# Patient Record
Sex: Female | Born: 1970 | Race: White | Hispanic: No | Marital: Married | Smoking: Never smoker
Health system: Southern US, Community
[De-identification: ages and names within clinical notes are randomized; demographics above are authoritative.]

## PROBLEM LIST (undated history)

## (undated) DIAGNOSIS — I959 Hypotension, unspecified: Secondary | ICD-10-CM

## (undated) HISTORY — DX: Hypotension, unspecified: I95.9

## (undated) HISTORY — PX: PELVIC LAPAROSCOPY: SHX162

## (undated) HISTORY — PX: AUGMENTATION MAMMAPLASTY: SUR837

---

## 1996-11-03 HISTORY — PX: RHINOPLASTY: SUR1284

## 1998-12-03 ENCOUNTER — Other Ambulatory Visit: Admission: RE | Admit: 1998-12-03 | Discharge: 1998-12-03 | Payer: Self-pay | Admitting: Gynecology

## 1999-05-08 ENCOUNTER — Other Ambulatory Visit: Admission: RE | Admit: 1999-05-08 | Discharge: 1999-05-08 | Payer: Self-pay | Admitting: Gynecology

## 1999-12-06 ENCOUNTER — Other Ambulatory Visit: Admission: RE | Admit: 1999-12-06 | Discharge: 1999-12-06 | Payer: Self-pay | Admitting: Gynecology

## 2000-11-03 HISTORY — PX: BREAST SURGERY: SHX581

## 2001-01-14 ENCOUNTER — Other Ambulatory Visit: Admission: RE | Admit: 2001-01-14 | Discharge: 2001-01-14 | Payer: Self-pay | Admitting: Gynecology

## 2002-01-25 ENCOUNTER — Other Ambulatory Visit: Admission: RE | Admit: 2002-01-25 | Discharge: 2002-01-25 | Payer: Self-pay | Admitting: Gynecology

## 2003-01-26 ENCOUNTER — Other Ambulatory Visit: Admission: RE | Admit: 2003-01-26 | Discharge: 2003-01-26 | Payer: Self-pay | Admitting: Gynecology

## 2004-02-05 ENCOUNTER — Other Ambulatory Visit: Admission: RE | Admit: 2004-02-05 | Discharge: 2004-02-05 | Payer: Self-pay | Admitting: Gynecology

## 2005-02-05 ENCOUNTER — Other Ambulatory Visit: Admission: RE | Admit: 2005-02-05 | Discharge: 2005-02-05 | Payer: Self-pay | Admitting: Gynecology

## 2006-02-09 ENCOUNTER — Other Ambulatory Visit: Admission: RE | Admit: 2006-02-09 | Discharge: 2006-02-09 | Payer: Self-pay | Admitting: Gynecology

## 2006-02-16 ENCOUNTER — Encounter: Admission: RE | Admit: 2006-02-16 | Discharge: 2006-02-16 | Payer: Self-pay | Admitting: Gynecology

## 2007-02-15 ENCOUNTER — Other Ambulatory Visit: Admission: RE | Admit: 2007-02-15 | Discharge: 2007-02-15 | Payer: Self-pay | Admitting: Gynecology

## 2007-08-30 ENCOUNTER — Other Ambulatory Visit: Admission: RE | Admit: 2007-08-30 | Discharge: 2007-08-30 | Payer: Self-pay | Admitting: Gynecology

## 2008-03-29 ENCOUNTER — Other Ambulatory Visit: Admission: RE | Admit: 2008-03-29 | Discharge: 2008-03-29 | Payer: Self-pay | Admitting: Gynecology

## 2008-04-11 ENCOUNTER — Encounter: Admission: RE | Admit: 2008-04-11 | Discharge: 2008-04-11 | Payer: Self-pay | Admitting: Gynecology

## 2008-10-16 ENCOUNTER — Ambulatory Visit: Payer: Self-pay | Admitting: Gynecology

## 2009-06-12 ENCOUNTER — Ambulatory Visit: Payer: Self-pay | Admitting: Gynecology

## 2009-06-12 ENCOUNTER — Other Ambulatory Visit: Admission: RE | Admit: 2009-06-12 | Discharge: 2009-06-12 | Payer: Self-pay | Admitting: Gynecology

## 2009-06-12 ENCOUNTER — Encounter: Payer: Self-pay | Admitting: Gynecology

## 2009-06-27 ENCOUNTER — Encounter: Admission: RE | Admit: 2009-06-27 | Discharge: 2009-06-27 | Payer: Self-pay | Admitting: Gynecology

## 2010-02-27 ENCOUNTER — Ambulatory Visit: Payer: Self-pay | Admitting: Gynecology

## 2010-03-15 IMAGING — MG MM SCREENING W/IMPLANTS
8 series · 8 of 8 positions shown · non-contrast
Comparison: Prior studies.

DG SCREENING W/IMPLANTS
Bilateral CC and MLO view(s) were taken.
Prior study comparison: February 16, 2006, bilateral DG screening w/implants.

DIGITAL SCREENING MAMMOGRAM W/IMPLANTS WITH CAD:

[R CC]
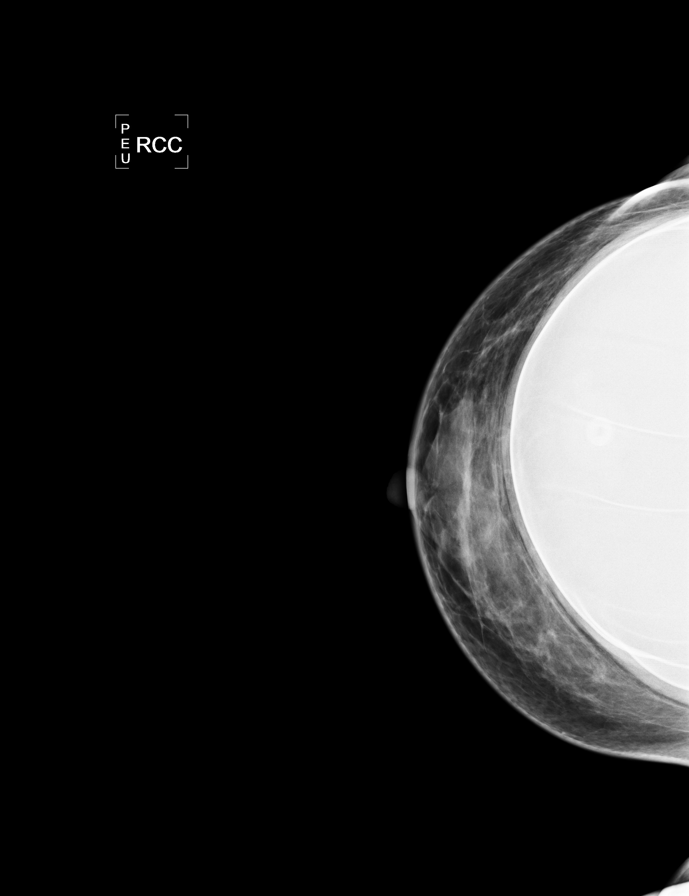

[L CC]
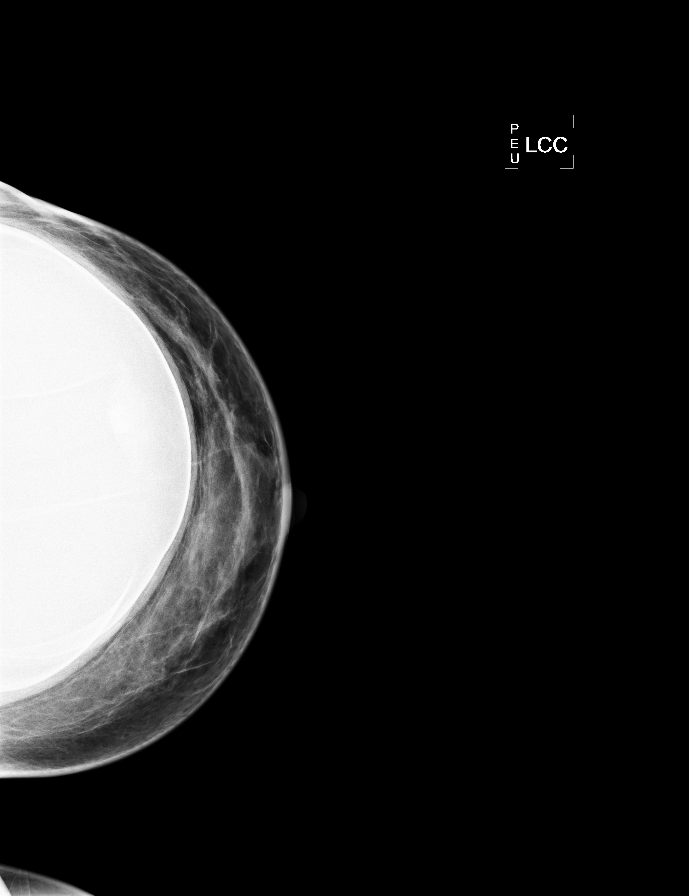

[L MLO]
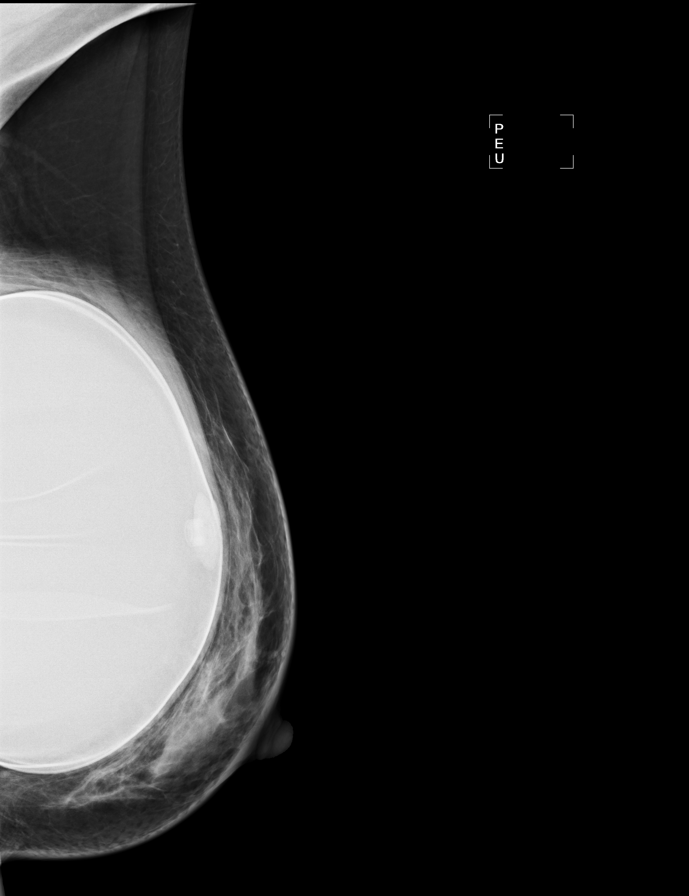

[R MLO]
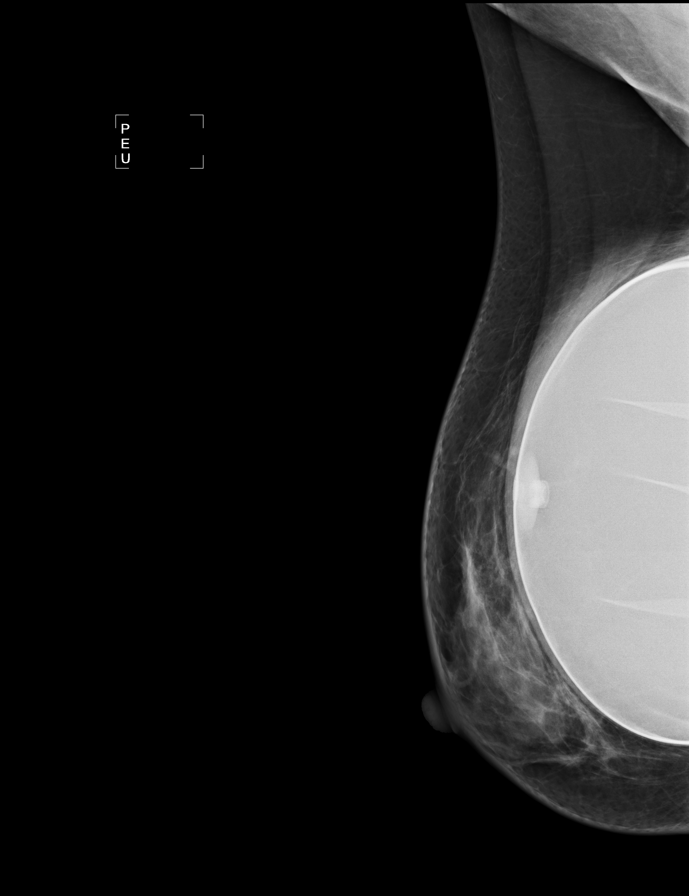

[R CCID]
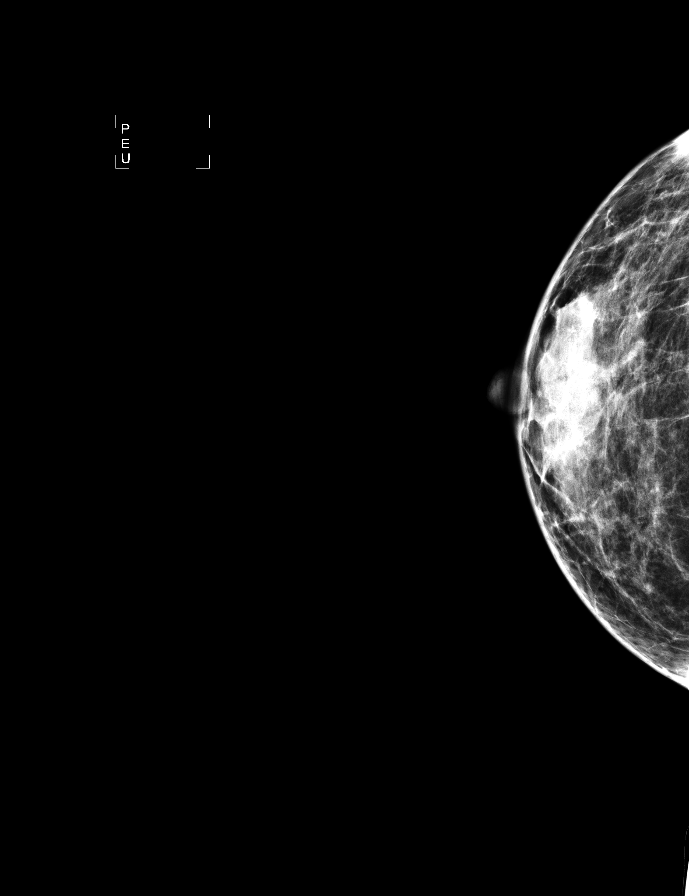

[L CCID]
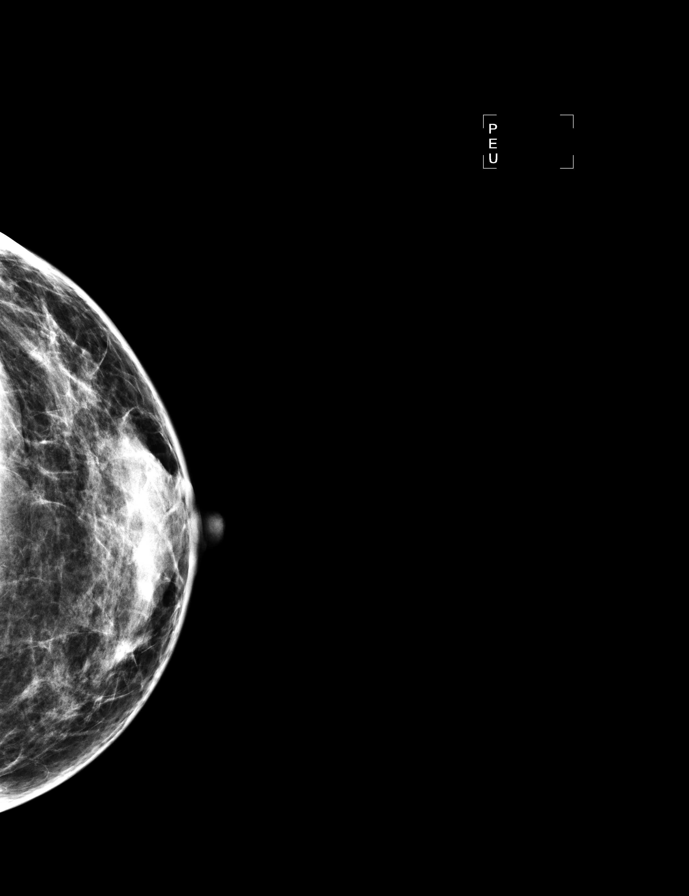

[L MLOID]
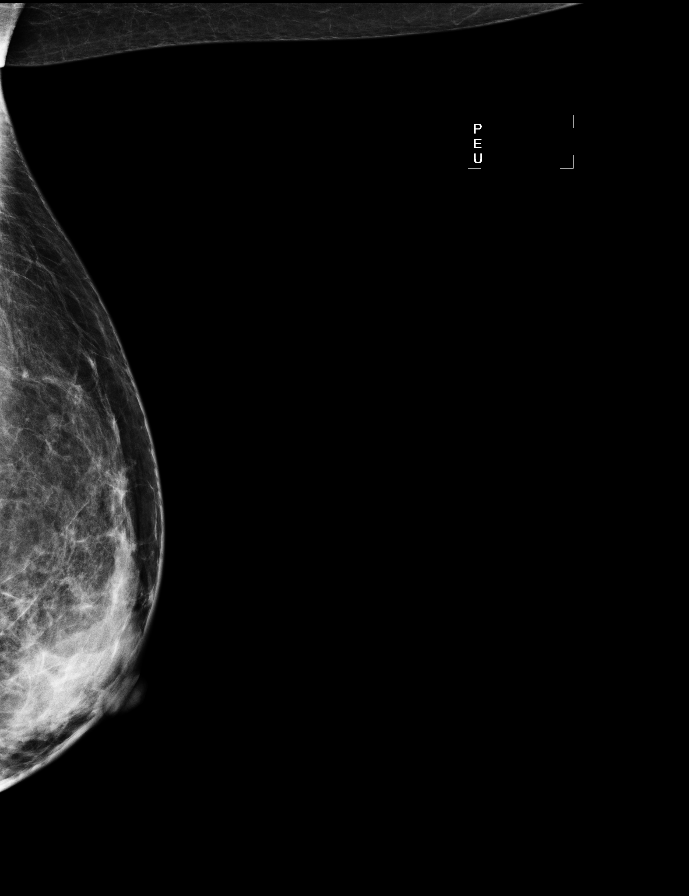

[R MLOID]
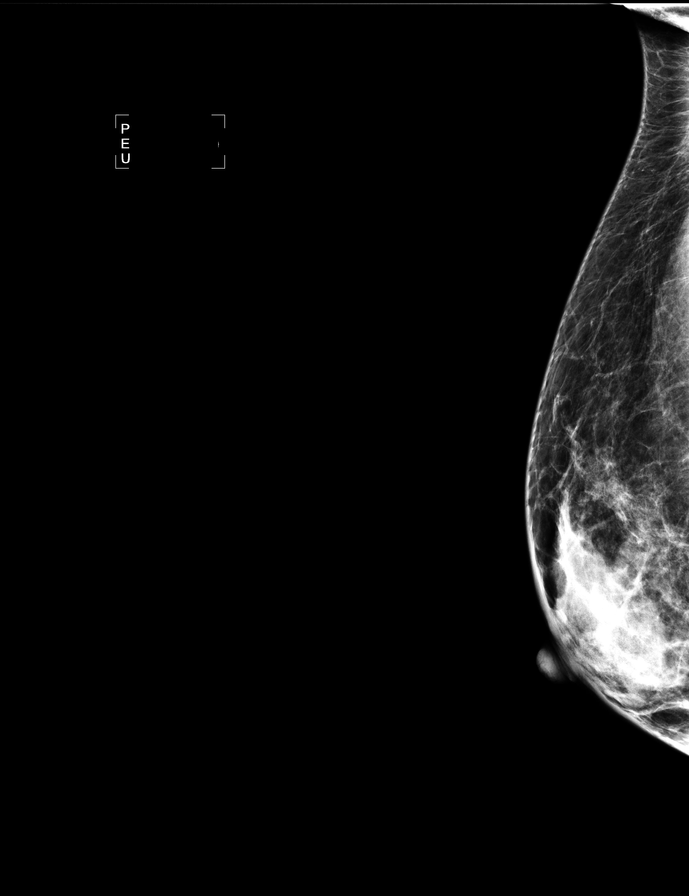

[8 of 8 positions shown; findings below may reference images not displayed]

There are subpectoral saline implants.  Implant included and implant displaced views are obtained.

The breast tissue is heterogeneously dense.  There is no dominant mass, architectural distortion or
calcification to suggest malignancy.

Images were processed with CAD.
IMPRESSION: No mammographic evidence of malignancy.  Suggest yearly screening mammography.

A result letter of this screening mammogram will be mailed directly to the patient.

ASSESSMENT: Negative - BI-RADS 1

Screening mammogram of both breasts in 1 year.
,

## 2010-08-13 ENCOUNTER — Ambulatory Visit: Payer: Self-pay | Admitting: Gynecology

## 2010-08-13 ENCOUNTER — Other Ambulatory Visit: Admission: RE | Admit: 2010-08-13 | Discharge: 2010-08-13 | Payer: Self-pay | Admitting: Gynecology

## 2010-09-19 ENCOUNTER — Encounter: Admission: RE | Admit: 2010-09-19 | Discharge: 2010-09-19 | Payer: Self-pay | Admitting: Gynecology

## 2011-08-18 ENCOUNTER — Encounter: Payer: Self-pay | Admitting: Anesthesiology

## 2011-08-18 ENCOUNTER — Other Ambulatory Visit: Payer: Self-pay | Admitting: Gynecology

## 2011-08-18 DIAGNOSIS — Z1231 Encounter for screening mammogram for malignant neoplasm of breast: Secondary | ICD-10-CM

## 2011-08-26 ENCOUNTER — Ambulatory Visit (INDEPENDENT_AMBULATORY_CARE_PROVIDER_SITE_OTHER): Payer: Self-pay | Admitting: Gynecology

## 2011-08-26 ENCOUNTER — Encounter: Payer: Self-pay | Admitting: Gynecology

## 2011-08-26 ENCOUNTER — Other Ambulatory Visit (HOSPITAL_COMMUNITY)
Admission: RE | Admit: 2011-08-26 | Discharge: 2011-08-26 | Disposition: A | Discharge status: Home / Self Care | Payer: Self-pay | Source: Ambulatory Visit | Attending: Gynecology | Admitting: Gynecology

## 2011-08-26 DIAGNOSIS — IMO0001 Reserved for inherently not codable concepts without codable children: Secondary | ICD-10-CM

## 2011-08-26 DIAGNOSIS — Z01419 Encounter for gynecological examination (general) (routine) without abnormal findings: Secondary | ICD-10-CM

## 2011-08-26 DIAGNOSIS — R823 Hemoglobinuria: Secondary | ICD-10-CM

## 2011-08-26 DIAGNOSIS — R300 Dysuria: Secondary | ICD-10-CM

## 2011-08-26 DIAGNOSIS — Z309 Encounter for contraceptive management, unspecified: Secondary | ICD-10-CM

## 2011-08-26 MED ORDER — LEVONORGESTREL-ETHINYL ESTRAD 0.1-20 MG-MCG PO TABS: 1.0000 | ORAL_TABLET | Freq: Every day | ORAL | Status: DC

## 2011-08-26 NOTE — Progress Notes (Signed)
[name] [birth year] [id number]   History:    40 y.o.  for annual exam with a complaint of urinary frequency and dysuria. She frequently does her monthly self breast examination. Last mammogram November 2011. She denies fever chills nausea vomiting her back pain to some slight suprapubic discomfort. She is only Laura Short 40 year old contraceptive pills and is having normal menstrual cycles.  Past medical history,surgical history, family history and social history were all reviewed and documented in the EPIC chart.  ROS:  Was performed and pertinent positives and negatives are included in the history.  Exam: chaperone present BP 110/70  Ht 5\' 2"  (1.575 m)  Wt 127 lb (57.607 kg)  BMI 23.23 kg/m2  LMP 08/06/2011  Body mass index is 23.23 kg/(m^2).  General appearance : Well developed well nourished female. No acute distress HEENT: Neck supple, trachea midline, no carotid bruits, no thyroidmegaly Lungs: Clear to auscultation, no rhonchi or wheezes, or rib retractions  Heart: Regular rate and rhythm, no murmurs or gallops Breast:Examined in sitting and supine position were symmetrical in appearance, no palpable masses or tenderness,  no skin retraction, no nipple inversion, no nipple discharge, no skin discoloration, no axillary or supraclavicular lymphadenopathy Abdomen: no palpable masses or tenderness, no rebound or guarding Extremities: no edema or skin discoloration or tenderness  Pelvic:  Bartholin, Urethra, Skene Glands: Within normal limits             Vagina: No gross lesions or discharge  Cervix: No gross lesions or discharge  Uterus  anteverted, normal size, shape and consistency, non-tender and mobile  Adnexa  Without masses or tenderness  Anus and perineum  normal   Rectovaginal  normal sphincter tone without palpated masses or tenderness             Hemoccult not done     Assessment/Plan:  40 y.o. female for annual exam with evidence of urinary tract infection based on  her symptoms and urinalysis demonstrating 6-8 WBC 1+ bacteria. She'll be placed on Cipro 500 mg twice a day for a seven-day course and pyridium 200 mg 3 times a day when necessary for to 3 days. She was encouraged to continue monthly self breast examination. Requisition was provided for to schedule her next mammogram next month. CBC cholesterol urinalysis and Pap smear was done today. We'll see her back in one year or when necessary.    Ok Edwards MD, 3:27 PM 08/26/2011

## 2011-09-23 ENCOUNTER — Ambulatory Visit
Admission: RE | Admit: 2011-09-23 | Discharge: 2011-09-23 | Disposition: A | Discharge status: Home / Self Care | Payer: Self-pay | Source: Ambulatory Visit | Attending: Gynecology | Admitting: Gynecology

## 2011-09-23 DIAGNOSIS — Z1231 Encounter for screening mammogram for malignant neoplasm of breast: Secondary | ICD-10-CM

## 2012-07-15 ENCOUNTER — Other Ambulatory Visit: Payer: Self-pay | Admitting: Gynecology

## 2012-09-28 ENCOUNTER — Encounter: Payer: Self-pay | Admitting: Gynecology

## 2012-09-28 ENCOUNTER — Other Ambulatory Visit: Payer: Self-pay | Admitting: Gynecology

## 2012-09-28 ENCOUNTER — Ambulatory Visit (INDEPENDENT_AMBULATORY_CARE_PROVIDER_SITE_OTHER): Payer: Self-pay | Admitting: Gynecology

## 2012-09-28 VITALS — BP 120/70 | Ht 62.75 in | Wt 127.0 lb

## 2012-09-28 DIAGNOSIS — Z01419 Encounter for gynecological examination (general) (routine) without abnormal findings: Secondary | ICD-10-CM

## 2012-09-28 DIAGNOSIS — Z803 Family history of malignant neoplasm of breast: Secondary | ICD-10-CM

## 2012-09-28 DIAGNOSIS — Z1231 Encounter for screening mammogram for malignant neoplasm of breast: Secondary | ICD-10-CM

## 2012-09-28 DIAGNOSIS — Z23 Encounter for immunization: Secondary | ICD-10-CM

## 2012-09-28 LAB — CBC WITH DIFFERENTIAL/PLATELET
Basophils Absolute: 0 10*3/uL (ref 0.0–0.1)
Basophils Relative: 1 % (ref 0–1)
Eosinophils Relative: 1 % (ref 0–5)
HCT: 39.9 % (ref 36.0–46.0)
MCH: 28.9 pg (ref 26.0–34.0)
MCHC: 32.8 g/dL (ref 30.0–36.0)
MCV: 87.9 fL (ref 78.0–100.0)
Monocytes Absolute: 0.5 10*3/uL (ref 0.1–1.0)
RDW: 12.6 % (ref 11.5–15.5)

## 2012-09-28 MED ORDER — LEVONORGESTREL-ETHINYL ESTRAD 0.1-20 MG-MCG PO TABS: 1.0000 | ORAL_TABLET | Freq: Every day | ORAL | Status: DC

## 2012-09-28 NOTE — Progress Notes (Signed)
Laura Short [birth year] [id number]   History:    41 y.o.  for annual gyn exam with no complaints today. Patient is having normal menstrual cycle and is currently on Aviane 28 day oral contraceptive pill. Patient with history several years ago in [place] she had a laparoscopic ovarian cystectomy for which we have no documentation. She also has a history of saline breast augmentation in the past.Patient's sister had breast cancer at the age of 36 and also a cousin as well. One cousin also had ovarian cancer.  Review of her record also indicated in the past and she had been offered BRCA 1 BRCA2 testing and literature and information been provided. Due to limited financial resources she has not had this done. Patient was requesting a flu vaccine today. Her last mammogram was November 2012 which was normal. Patient currently does her self breast examination.   Past medical history,surgical history, family history and social history were all reviewed and documented in the EPIC chart.  Gynecologic History Patient's last menstrual period was 09/01/2012. Contraception: OCP (estrogen/progesterone) Last Pap: 2012. Results were: normal Last mammogram: 2012. Results were: normal  Obstetric History OB History    Grav Para Term Preterm Abortions TAB SAB Ect Mult Living   2 2 2       2      # Outc Date GA Lbr Len/2nd Wgt Sex Del Anes PTL Lv   1 TRM     M SVD   Yes   2 TRM     F SVD   Yes       ROS: A ROS was performed and pertinent positives and negatives are included in the history.  GENERAL: No fevers or chills. HEENT: No change in vision, no earache, sore throat or sinus congestion. NECK: No pain or stiffness. CARDIOVASCULAR: No chest pain or pressure. No palpitations. PULMONARY: No shortness of breath, cough or wheeze. GASTROINTESTINAL: No abdominal pain, nausea, vomiting or diarrhea, melena or bright red blood per rectum. GENITOURINARY: No urinary frequency, urgency, hesitancy or dysuria.  MUSCULOSKELETAL: No joint or muscle pain, no back pain, no recent trauma. DERMATOLOGIC: No rash, no itching, no lesions. ENDOCRINE: No polyuria, polydipsia, no heat or cold intolerance. No recent change in weight. HEMATOLOGICAL: No anemia or easy bruising or bleeding. NEUROLOGIC: No headache, seizures, numbness, tingling or weakness. PSYCHIATRIC: No depression, no loss of interest in normal activity or change in sleep pattern.     Exam: chaperone present  BP 120/70  Ht 5' 2.75" (1.594 m)  Wt 127 lb (57.607 kg)  BMI 22.68 kg/m2  LMP 09/01/2012  Body mass index is 22.68 kg/(m^2).  General appearance : Well developed well nourished female. No acute distress HEENT: Neck supple, trachea midline, no carotid bruits, no thyroidmegaly Lungs: Clear to auscultation, no rhonchi or wheezes, or rib retractions  Heart: Regular rate and rhythm, no murmurs or gallops Breast:Examined in sitting and supine position were symmetrical in appearance, no palpable masses or tenderness,  no skin retraction, no nipple inversion, no nipple discharge, no skin discoloration, no axillary or supraclavicular lymphadenopathy Abdomen: no palpable masses or tenderness, no rebound or guarding Extremities: no edema or skin discoloration or tenderness  Pelvic:  Bartholin, Urethra, Skene Glands: Within normal limits             Vagina: No gross lesions or discharge  Cervix: No gross lesions or discharge  Uterus  anteverted, normal size, shape and consistency, non-tender and mobile  Adnexa  Without masses or tenderness  Anus  and perineum  normal   Rectovaginal  normal sphincter tone without palpated masses or tenderness             Hemoccult not indicated     Assessment/Plan:  41 y.o. female for annual exam with no gross abnormalities noted. Patient will be scheduled for mammogram and I have recommended that she have a three-dimensional study due to her family history of breast cancer, prior breast augmentation and dense  breasts. She was encouraged to continue her monthly self breast examination. We once again recommended BRCA one BRCA2 testing. The following labs will be ordered: CBC, cholesterol, urinalysis. Patient received flu vaccine today. We discussed importance of calcium vitamin D along with regular exercise for osteoporosis prevention. No Pap smear done today new guidelines discussed.    Ok Edwards MD, 12:56 PM 09/28/2012

## 2012-09-28 NOTE — Patient Instructions (Addendum)
Calcium [phone] mg/dia Vitamina D3 no mas de 2,000 unidades por dia  [place] difteria/ttanos (Td) o [place] difteria, ttanos, tos convulsa (Tdap), Lo que debe saber (Tetanus, Diphtheria [Td] or Tetanus, Diphtheria, Pertussis [Tdap] Vaccine, What You Need to Know) PORQU VACUNARSE? El ttanos , la difteria y la tos ferina pueden ser enfermedades graves.  El TTANOS  (trismo) provoca la contraccin dolorosa y rigidez de los msculos, por lo general, en todo el cuerpo.   Puede causar la contraccin de los msculos de la cabeza y el cuello de modo que el enfermo no puede abrir la boca ni tragar., y en algunos casos, tampoco puede respirar.. El ttanos causa la muerte de 1 de cada 5 personas que se infectan. LA DIFTERIA produce la formacin de una membrana gruesa que cubre el fondo de la garganta.  Puede causar problemas respiratorios, parlisis, insuficiencia cardaca, e incluso la muerte. El PERTUSIS (tos [place]) causa ataques de tos intensa que pueden dificultar la respiracin, provocar vmitos e interrumpir el sueo.   Puede causar prdida de peso, incontinencia, fractura de [place], y desmayos por la intensa tos. Hasta de 2 de cada 100 adolescentes y 5 de cada 100 adultos que enferman de tos [place] deben ser hospitalizados o tienen complicaciones como la neumona y la [place]. Estas 3 enfermedades son provocadas por bacterias. La difteria y la tos Benetta Spar se [place] de persona a Social worker. El ttanos ingresa al organismo a travs de cortes, rasguos o heridas. En los [place] ocurran alrededor de 200 000 casos por ao de difteria y tos [place], antes de que existieran las [place], y tambin ocurran cientos de casos de ttanos. Desde la aparicin de las vacunas, el ttanos y la difteria han disminuido en alrededor del 99% y los casos de tos ferina disminuyeron aproximadamente el 92%.  Los nios menores de 6 aos deben recibir la vacuna DTaP para estar protegidos contra estas tres  enfermedades. Pero los [employer], los adolescentes y los adultos tambin necesitan proteccin. VACUNAS PARA ADOLESCENTES Y ADULTOS Vacunas Tdap y Td  Hay dos vacunas disponibles para proteger de estas enfermedades a nios a Glass blower/designer de los 7aos:   La vacuna Td fue utilizada durante muchos aos. Protege contra el ttanos y la difteria.  La vacuna Tdap fue autorizada en 2005. Es la primera vacuna para adolescentes y adultos que protege contra la tos ferina y el ttanos y la difteria. Una dosis de refuerzo de la Td se recomienda cada 10 aos. La Tdap se aplica slo una vez.  QU VACUNA DEBO APLICARME Y CUANDO? Las edades de 7 a 18 aos  Dynegy 11 y los 12 aos se recomienda una dosis de Tdap. Esta dosis puede aplicarse desde los 7 aos en los nios que no han recibido una o ms dosis de DTaP anteriormente.  Los nios y adolescentes que no recibieron todas las dosis programadas de DTaP o DTP a los 7 aos deben completar la serie usando una combinacin de Td y Tdap. Adultos de 19 aos o ms  [employer] adultos deben recibir una dosis de refuerzo de Td cada 10 aos. Los adultos de menos de 65 aos que nunca hayan recibido la Tdap deben reemplazarla por la siguiente dosis de refuerzo. Los adultos a partir de los 65 aos puedenrecibir una dosis de Tdap.  Los adultos (incluyendo las mujeres que podran quedar embarazadas y los adultos mayores de 65 aos) que tienen contacto cercano con un beb menor de 12 meses deben aplicarse una dosis de  Tdap para proteger al beb de la tos ferina.  Los trabajadores de la salud que tengan contacto directo con pacientes en hospitales o clnicas deben recibir una dosis de Tdap. Proteccin despus de [place] herida  Es posible que una persona que tenga un corte o quemadura grave necesite una dosis de Td o Tdap para prevenir la infeccin por ttanos. Puede usarse la Tdap en personas que nunca recibieron una dosis. Pero debe usarse la Td, si la Tdap no se encuentra  disponible, o para:  Cualquier persona que haya recibido una dosis de Tdap.  Los nios [employer] 7 y los 9 aos que han [employer].H. [employer] Worldwide series de DTap anteriormente.  Adultos de 65 aos o ms. Mujeres embarazadas.   Las mujeres embarazadas que nunca recibieron una dosis de Ddap deben recibirla despus de la 20a semana de gestacin y preferiblemente durante Contractor. trimestre. Si no se aplican la Tdap durante el embarazo, deben recibirla lo antes posible despus del parto. Las mujeres embarazadas que han recibido la Tdap y tienen que aplicarse la vacuna contra el ttanos o la difteria durante el [place], deben recibir la Td. Las vacunas Tdap y Td pueden ser administradas al mismo tiempo que otras vacunas. ALGUNAS PERSONAS NO DEBEN RECIBIR LA VACUNA O DEBEN [employer]ard  Las personas que hayan tenido una reaccin alrgica que haya puesto en peligro su vida despus de una dosis de vacuna contra el ttanos, la difteria o la tos ferina no deben recibir Td ni Tdap..  Las personas que tengan alergias graves a algn componente de una vacuna no deben recibir esa vacuna. Informe a su mdico si la persona que recibe la vacuna sufre alergias graves.  Cualquier persona que American Standard Companies en coma o que haya tenido convulsiones dentro de los 7 [address]  [address] posteriores despus de una dosis de DTP o DTaP no debe recibir la Tdap, salvo que se encuentre una causa que no fuera la vacuna. Estas personas pueden recibir Td.  Consulte a su mdico si la persona que recibe [place] de las vacunas:  Tiene epilepsia o algn otro problema del sistema nervioso.  Tuvo inflamacin o dolor intenso despus de una dosis de DTP, DTaP, DT, Td, o Tdap.  Ha tenido el sndrome de Scientific laboratory technician (GBS por sus siglas en ingls). Las personas que sufran una enfermedad moderada o grave el da en que se programa la vacuna, deben esperar a recuperarse para recibir las vacunas Tdap o Td. Por lo general, una persona con una enfermedad leve o fiebre baja  puede recibir la vacuna. CULES SON LOS RIESGOS DE LAS VACUNAS TDAP Y TD? Con Cathleen Corti, al igual que con cualquier [employer] Data, siempre existe un pequeo riesgo de una reaccin alrgica que ponga en peligro la vida o cause otro problema grave. Todo procedimiento mdico, inclusive la vacunacin pueden causar breves episodios de lipotimia o sntomas relacionados (como movimientos espasmdicos). Para evitar los [employer] y las lesiones causadas por las cadas, permanezca sentado o recustese durante los 15 minutos posteriores a la vacunacin. Informe a su mdico si el paciente se siente dbil o mareado, tiene cambios en la visin o siente zumbidos en los odos.  Es mucho ms probable que tener ttanos, difteria, o tos ferina cause problemas ms graves que los provocados por recibir cualquiera de las vacunas Td o Tdap. A continuacin se enumeran los problemas informados despus de las vacunas Td y Tdap. Problemas Leves (perceptibles, pero que no interfirieron con las actividades): Tdap  Dolor (alrededor de 3 de cada  4 adolescentes y 2 de cada 3 adultos).  Enrojecimiento o inflamacin en el sitio de la inyeccin (alrededor de 1 de cada 5).  Fiebre leve de al menos 100.4 F (38 C) (hasta alrededor de 1 cada 25 adolescentes y 1 de cada 100 adultos).  Dolor de cabeza (alrededor de 4 de cada 10 adolescentes y 3 de cada 10 adultos).  Cansancio (alrededor de 1 de cada 3 adolescentes y 1 de cada 4 adultos).  Nuseas, vmitos, diarrea, o dolor de estmago (hasta 1 de cada 4 adolescentes y 1 de cada 10 adultos).  Escalofros, dolores corporales, dolor articular, erupciones, o inflamacin de las glndulas (poco frecuente). Td  Dolor (hasta alrededor de 8 de cada 10).  Enrojecimiento o inflamacin de la inyeccin (alrededor de 1 de cada 3).  Fiebre leve (hasta alrededor de 1 de cada 5).  Dolor de cabeza o cansancio (poco frecuente). Problemas Moderados (interfieren con las [place], [place] no  requieren atencin mdica): Tdap  Dolor en el sitio de la inyeccin (alrededor de 1 de cada 20 adolescentes y 1 de cada 100 adultos).  Enrojecimiento o inflamacin de la inyeccin (alrededor de 1 de cada 16 adolescentes y 1 de cada 25 adultos).  Fiebre de ms de 102 F (38.9 C) (alrededor de 1 de cada 100 adolescentes y 1 de cada 250 adultos).  Dolor de cabeza (1 de cada 300).  Nuseas, vmitos, diarrea, o dolor de estmago (hasta 3 de cada 100 adolescentes y 1 de cada 100 adultos). Td  Fiebre de ms de 102 F (38.9 C) (poco comn). Tdap o Td  Inflamacin de gran extensin en el brazo en el que se aplic la vacuna (hasta 3 de cada 100). Problemas Graves (no puede realizar [employer]; requiere Psychologist, prison and probation services) Tdap o Td  Inflamacin, dolor intenso, sangrado y enrojecimiento en el brazo, en el sitio de la inyeccin (poco frecuente). Puede producirse una reaccin alrgica grave despus de cualquier vacuna. Se estima que estas reacciones ocurren en menos de una de cada un milln de dosis. QU PASA SI HAY UNA REACCIN GRAVE? Qu signos debo buscar? Cualquier estado poco habitual, como una reaccin alrgica grave o fiebre alta. Si le produce Runner, broadcasting/film/video grave, se manifestar dentro de algunos minutos a una hora despus de recibir la vacuna. Entre los signos de Automotive engineer grave se encuentran la dificultad para respirar, debilidad, ronquera o sibilancias, latidos cardacos acelerados, urticaria, mareos, palidez, o inflamacin de la garganta. Qu debo hacer?  Comunquese con su mdico o lleve inmediatamente a la persona al mdico.  Dgale a su mdico qu ocurri, la fecha y hora en que sucedi y cundo le aplicaron la vacuna.  Pida a su mdico que informe sobre la reaccin llenando un formulario del Sistema de Informacin de Reacciones Adversos a las Administrator, arts (VAERS, por sus siglas en ingls). O, puede presentar este informe a travs del sitio web de VAERS  [email or web address] o puede llamar al [phone]. VAERS no brinda asistencia mdica. PROGRAMA NACIONAL DE COMPENSACIN DE DAOS POR VACUNAS El [place] de Compensacin de Daos por Vacunas (VICP) fue creado en 1986.  Aquellas personas que consideren que han sufrido un dao como consecuencia de una vacuna y quieren saber ms acerca del programa y como presentar Roslynn Amble, [place] llamar al [phone] o visitar su sitio web en [email or web address]  CMO Roxan Diesel MS INFORMACIN?  El profesional podr darle el prospecto de la vacuna o sugerirle otras fuentes de informacin.  Comunquese con el servicio de [employer]  de su localidad o su estado.  Comunquese con los Centros para el control y la prevencin de Child psychotherapist for Disease Control and Prevention , CDC).  Llame al [phone] (1-800-CDC-INFO).  Visite los sitios web de Energy Transfer Partners en [email or web address].uy CDC Td and Tdap Interim VIS-Spanish (11/26/10) Document Released: 02/05/2009 Document Revised: 01/12/2012 [facility] Patient Information 2013 [place], [place].  Vacuna desactivada contra la influenza, Lo que usted necesita saber (Inactivated Influenza Vaccine, What You Need to Know) POR QU VACUNARSE?  La influenza (conocida como gripe o "flu") es una enfermedad contagiosa.  Es causada por el virus de la influenza, que se puede transmitir al toser, Engineering geologist o mediante las secreciones nasales.  A cualquiera le puede dar influenza, pero los ndices de infeccin son [employer]Ex nios. La Harley-Davidson de las personas solo experimentan sntomas por unos pocos das e incluyen:  Teacher, English as a foreign language o escalofros.  Dolor de Advertising copywriter.  Dolores musculares.  Cansancio.  Tos.  Dolor de [place].  Nariz moquienta o congestionada. Otras enfermedades pueden DIRECTV mismos sntomas y a menudo se confunden con la influenza. Los nios pequeos, las Smith International de 65 aos de edad, las mujeres  embarazadas y las personas con ciertas condiciones de salud, como enfermedades del corazn, pulmn o rin o un sistema inmunolgico debilitado, se pueden enfermar mucho ms. La influenza puede causar fiebre alta y neumona y puede empeorar condiciones de salud preexistentes. Puede causar diarrea y convulsiones en los nios. Miles de personas mueren cada ao por la influenza y muchas ms requieren hospitalizacin. Si se vacuna, puede protegerse usted mismo y Arts administrator a otros. VACUNA DESACTIVADA CONTRA LA INFLUENZA  Hay dos tipos de vacuna contra la influenza:  La vacuna inactivada (el virus est inactivo), de la "vacuna contra la gripe" se aplica con una aguja.  La vacuna viva atenuada (debilitado), que see aplica como roco en las fosas nasales. Esta vacuna se describe en una [place] de Informacin sobre las [place], por separado. Hay una "dosis ms alta" de vacuna desactivada disponible para personas mayores de 65 aos. Para ms informacin, consulte a su doctor.  Cada ao los cientficos tratan de que los virus de la vacuna coincidan con los que tienen ms probabilidades de causar la influenza ese ao. La vacuna contra la influenza no prevendr otras enfermedades causadas por otros virus, incluyendo los virus de influenza que no estn incluidos en la vacuna. Despus de la vacunacin, toma hasta 2 semanas para desarrollar proteccin. La proteccin dura hasta un ao. Algunas vacunas desactivadas contra la influenza contienen un conservante llamado timerosal. La vacuna libre de timerosal tambin est disponible. Consulte a su doctor para ms informacin. QUINES DEBEN RECIBIR LA VACUNA DESACTIVADA CONTRA LA INFLUENZA Y CUNDO? QUINES  Todas las Smith International de 6 meses de edad deben recibir la vacuna contra la influenza.  La vacunacin es especialmente importante para las personas con mayor riesgo de experimentar un caso grave de influenza y las que estn en contacto directo con ellas,  incluyendo al personal mdico, y las personas en contacto cercano con bebs menores de 6 meses de edad. CUNDO Reciba la vacuna tan pronto como est disponible. Esto le dar la proteccin necesaria en caso de que la temporada de influenza llegue temprano. Puede vacunarse durante todo el tiempo en el que la enfermedad siga ocurriendo en su comunidad. La influenza puede ocurrir a Customer service manager, pero la [place] de influenza ocurre desde octubre [employer] Corporation. En las ltimas temporadas, la mayora de las infecciones han ocurrido en enero  y febrero. Vacunndose Science Applications International, o an despus, ser beneficioso en casi todos los [place]. Los adultos y los nios mayores requieren una dosis de la vacuna contra la influenza cada ao. Sin embargo, algunos nios menores de 9 aos de edad [address] dosis para estar protegidos. Consulte a su doctor. Se puede dar la vacuna contra la influenza a la misma vez que otras vacunas, incluyendo la vacuna antineumoccica. ALGUNAS PERSONAS NO DEBEN RECIBIR LA VACUNA DESACTIVADA CONTRA LA INFLUENZA O DEBEN ESPERAR  Diga a su doctor si tiene cualquier alergia grave (que amenaza la vida), incluyendo alergia grave a los [place]. Una grave alergia a cualquier componente de la vacuna puede ser razn para no vacunarse. Las Therapist, art a la vacuna contra la influenza son poco comunes.  Diga a su doctor si alguna vez ha tenido una reaccin grave despus de haber recibido una dosis de la vacuna contra la influenza.  Diga a su doctor si alguna vez ha tenido el sndrome de Pension scheme manager (una enfermedad paraltica grave, tambin conocida como GBS). Su doctor le puede ayudar a decidir si es recomendable vacunarse.  Las personas moderadamente o muy enfermas por lo general deben esperar hasta recuperarse antes de vacunarse contra la influenza. Si est enfermo, hable con su doctor sobre si debe cambiar la cita para vacunarse. Las personas con una enfermedad leve por lo general se  pueden vacunar. CULES SON LOS RIESGOS DE LA VACUNA DESACTIVADA CONTRA LA INFLUENZA? Los vacunas, como cualquier [place], pueden causar problemas serios, como Therapist, art graves. El riesgo de que la vacuna cause un dao serio, o la [place], es sumamente pequeo. Problemas serios de la vacuna desactivada contra la influenza ocurren muy rara vez. Los virus en la vacuna desactivada estn muertos o sea que no se puede enfermar de influenza mediante la vacuna. Problemas leves:  Molestia, enrojecimiento o hinchazn en el lugar donde lo vacunaron.  Ronquera; dolor, enrojecimiento y The [employer] ojos; tos.  Grant Ruts.  Dolores.  Dolor de [place].  Picazn.  Cansancio. Si estos problemas ocurren, en general comienzan poco tiempo despus de vacunarse y duran 1  2 das. Problemas moderados: Los nios pequeos que reciben la vacuna contra la influenza desactivada y la vacuna antineumoccica (PCV13) durante la misma cita parecen correr mayor riego de tener convulsiones por causa de fiebre. Consulte a su doctor para ms informacin. Diga a su doctor si el nio que est recibiendo la vacuna contra la influenza ha tenido una convulsin. Problemas graves:  Las reacciones alrgicas que amenazan la vida ocurren muy rara vez despus de la vacunacin. Si ocurren, por lo general es a los W[employer] Corporation o a las pocas horas de haberse vacunado.  En 1976, un tipo de vacuna contra la influenza (gripe porcina) estuvo asociado al sndrome de Guillain-Barr (GBS). Desde entonces, las vacunas contra la influenza no se han asociado claramente al GBS. Sin embargo, si hay un riesgo de GBS por las vacunas contra la influenza que se usan actualmente, no debe ser ms de 1  2 casos por milln de personas vacunadas. Eso es [employer]co Wholesale que el riesgo de tener una influenza fuerte, que se puede prevenir con vacunacin. Siempre se seguir prestando atencin a la seguridad de las vacunas. Para ms informacin  visite:  PrintingMaps.se y  [email or web address]-stevens.info/ [place] marca de la vacuna desactivada contra la influenza, llamada Afluria, no se debe dar a nios menores de 8 aos de edad, con la excepcin de circunstancias especiales. En [place] una vacuna relacionada Odelia Gage asociada a  fiebre y convulsiones febriles en nios pequeos. Su doctor le puede proporcionar ms informacin. QU PASA SI HAY UNA REACCIN GRAVE? A qu debo prestar atencin? Cualquier estado poco habitual, como fiebre alta o cambios en el comportamiento. Los signos de [place] reaccin alrgica grave pueden incluir dificultad para respirar, ronquera o sibilancias, ronchas, palidez, debilidad, latidos cardacos acelerados, o mareos. Qu debo hacer?  Llame a un doctor o lleve a la persona inmediatamente a un doctor.  Dga a su doctor lo que ocurri, la fecha y hora en que ocurri, y cuando recibi la vacuna.  Pida a su mdico, enfermero o al departamento de salud, que informe sobre la reaccin llenando un formulario del Sistema de Informacin de Reacciones Adversos a las Administrator, arts (VAERS, por sus siglas en ingls). O, puede presentar este informe mediante el sitio Web de VAERS, en:[email or web address] o llamando al: [phone]. VAERS no proporciona consejos mdicos. PROGRAMA NACIONAL DE COMPENSACIN POR LESIONES CAUSADAS POR VACUNAS El [place] de Compensacin por Lesiones Causadas por las Vacunas (VICP) fue creado en 1986.  Las personas que piensan haber sido lesionadas por alguna vacuna pueden aprender acerca del programa y cmo presentar una reclamacin llamando al: [phone] o visitando el sitio Web de VICP [email or web address].[email or web address] CMO Roxan Diesel MS INFORMACIN?  Consulte a su doctor. Le pueden dar el folleto de informacin que viene con la vacuna o sugerirle otras fuentes de informacin.  Llame al departamento de salud local  o estatal.  Comunquese con los Centros para el Control y la Prevencin de [place] (CDC):  Llame al [phone] (1-800-CDC-INFO) o  Visite el sitio Web de los CDC en [email or web address].cy CDC Inactivated Influenza Vaccine-Spanish VIS (05/05/11) Document Released: 01/16/2009 Document Revised: 01/12/2012 [facility] Patient Information 2013 [place], [place].

## 2012-09-29 LAB — URINALYSIS W MICROSCOPIC + REFLEX CULTURE
Bilirubin Urine: NEGATIVE
Casts: NONE SEEN
Glucose, UA: NEGATIVE mg/dL
Hgb urine dipstick: NEGATIVE
Protein, ur: NEGATIVE mg/dL
pH: 7 (ref 5.0–8.0)

## 2012-10-01 LAB — URINE CULTURE

## 2012-10-04 ENCOUNTER — Other Ambulatory Visit: Payer: Self-pay | Admitting: Gynecology

## 2012-10-04 MED ORDER — NITROFURANTOIN MONOHYD MACRO 100 MG PO CAPS: 100.0000 mg | ORAL_CAPSULE | Freq: Two times a day (BID) | ORAL | Status: AC

## 2012-11-12 ENCOUNTER — Ambulatory Visit
Admission: RE | Admit: 2012-11-12 | Discharge: 2012-11-12 | Disposition: A | Discharge status: Home / Self Care | Payer: Self-pay | Source: Ambulatory Visit | Attending: Gynecology | Admitting: Gynecology

## 2012-11-12 DIAGNOSIS — Z803 Family history of malignant neoplasm of breast: Secondary | ICD-10-CM

## 2012-11-12 DIAGNOSIS — Z1231 Encounter for screening mammogram for malignant neoplasm of breast: Secondary | ICD-10-CM

## 2013-10-05 ENCOUNTER — Encounter: Payer: Self-pay | Admitting: Gynecology

## 2013-10-05 ENCOUNTER — Ambulatory Visit (INDEPENDENT_AMBULATORY_CARE_PROVIDER_SITE_OTHER): Payer: Self-pay | Admitting: Gynecology

## 2013-10-05 VITALS — BP 118/76 | Ht 62.25 in | Wt 127.0 lb

## 2013-10-05 DIAGNOSIS — Z803 Family history of malignant neoplasm of breast: Secondary | ICD-10-CM

## 2013-10-05 DIAGNOSIS — Z01419 Encounter for gynecological examination (general) (routine) without abnormal findings: Secondary | ICD-10-CM

## 2013-10-05 MED ORDER — LEVONORGESTREL-ETHINYL ESTRAD 0.1-20 MG-MCG PO TABS: 1.0000 | ORAL_TABLET | Freq: Every day | ORAL | Status: DC

## 2013-10-05 NOTE — Progress Notes (Signed)
Laura Short 1971-08-02 [id number]   History:    42 y.o.  for annual gyn exam with no major complaints today. Patient would like to receive her blood work in [place] since she is going there . She is due for her mammogram.Patient is having normal menstrual cycle and is currently on Aviane 28 day oral contraceptive pill. Patient with history several years ago in [place] she had a laparoscopic ovarian cystectomy for which we have no documentation. She also has a history of saline breast augmentation in the past.Patient's sister had breast cancer at the age of 52 and also a cousin as well. One cousin also had ovarian cancer. Review of her record also indicated in the past and she had been offered BRCA 1 BRCA2 testing and literature and information been provided. Due to limited financial resources she has not had this done.  Patient refused the flu vaccine today.  Past medical history,surgical history, family history and social history were all reviewed and documented in the EPIC chart.  Gynecologic History Patient's last menstrual period was 09/27/2013. Contraception: OCP (estrogen/progesterone) Last Pap: 2012. Results were: normal Last mammogram: 2014. Results were: normal  Obstetric History OB History  Gravida Para Term Preterm AB SAB TAB Ectopic Multiple Living  2 2 2       2     # Outcome Date GA Lbr Len/2nd Weight Sex Delivery Anes PTL Lv  2 TRM     F SVD   Y  1 TRM     M SVD   Y       ROS: A ROS was performed and pertinent positives and negatives are included in the history.  GENERAL: No fevers or chills. HEENT: No change in vision, no earache, sore throat or sinus congestion. NECK: No pain or stiffness. CARDIOVASCULAR: No chest pain or pressure. No palpitations. PULMONARY: No shortness of breath, cough or wheeze. GASTROINTESTINAL: No abdominal pain, nausea, vomiting or diarrhea, melena or bright red blood per rectum. GENITOURINARY: No urinary frequency, urgency, hesitancy or  dysuria. MUSCULOSKELETAL: No joint or muscle pain, no back pain, no recent trauma. DERMATOLOGIC: No rash, no itching, no lesions. ENDOCRINE: No polyuria, polydipsia, no heat or cold intolerance. No recent change in weight. HEMATOLOGICAL: No anemia or easy bruising or bleeding. NEUROLOGIC: No headache, seizures, numbness, tingling or weakness. PSYCHIATRIC: No depression, no loss of interest in normal activity or change in sleep pattern.     Exam: chaperone present  BP 118/76  Ht 5' 2.25" (1.581 m)  Wt 127 lb (57.607 kg)  BMI 23.05 kg/m2  LMP 09/27/2013  Body mass index is 23.05 kg/(m^2).  General appearance : Well developed well nourished female. No acute distress HEENT: Neck supple, trachea midline, no carotid bruits, no thyroidmegaly Lungs: Clear to auscultation, no rhonchi or wheezes, or rib retractions  Heart: Regular rate and rhythm, no murmurs or gallops Breast:Examined in sitting and supine position were symmetrical in appearance, no palpable masses or tenderness,  no skin retraction, no nipple inversion, no nipple discharge, no skin discoloration, no axillary or supraclavicular lymphadenopathy Abdomen: no palpable masses or tenderness, no rebound or guarding Extremities: no edema or skin discoloration or tenderness  Pelvic:  Bartholin, Urethra, Skene Glands: Within normal limits             Vagina: No gross lesions or discharge  Cervix: No gross lesions or discharge  Uterus  anteverted, normal size, shape and consistency, non-tender and mobile  Adnexa  Without masses or tenderness  Anus  and perineum  normal   Rectovaginal  normal sphincter tone without palpated masses or tenderness             Hemoccult none indicated     Assessment/Plan:  42 y.o. female for annual exam who has 2 relatives with breast cancer which included her sister and first cousin. She was once again offered BRCA1 and BRCA2 testing. She will have 3 the mammogram next month. She was encouraged to do monthly  breast exam. Prescription refill for oral contraceptive pill was provided. Patient will bring back the results from her lab tests when she has been done in [place] later this month. We discussed the importance of calcium and vitamin D for osteoporosis prevention.  Note: This dictation was prepared with  Dragon/digital dictation along withSmart phrase technology. Any transcriptional errors that result from this process are unintentional.   Ok Edwards MD, 4:02 PM 10/05/2013

## 2013-10-05 NOTE — Patient Instructions (Signed)
Vacuna antigripal (vacuna antigripal inactivada) 2013 2014, Lo que debe saber  (Influenza Vaccine [Flu Vaccine, Inactivated] 2013 2014, What You Need to Know) PORQU VACUNARSE?   La influenza ("gripe") es una enfermedad contagiosa que se propaga por los [place] en invierno, por lo general entre octubre y mayo.  La causa de la gripe es el virus de la influenza, y se puede contagiar por la tos, al estornudar y por el contacto cercano.  Cualquier persona puede contraer la gripe, pero el riesgo es mayor entre los nios. Los sntomas aparecen rpidamente y pueden durar varios das. Pueden ser:  Fiebre o escalofros.  Dolor de garganta.  Dolores musculares.  La fatiga.  Tos.  Dolor de cabeza.  Secrecin o congestin nasal. La gripe puede hacer que algunas personas se enfermen ms que otros. Entre estas personas se incluyen a los nios pequeos, las personas mayores de 65 aos, las mujeres embarazadas y las personas con ciertas afecciones, como enfermedades cardacas, pulmonares o renales, o que tienen un sistema inmunolgico debilitado. La vacuna contra la gripe es especialmente importante para estas personas y para todos los que estn en estrecho contacto con ellos.  La gripe tambin puede causar neumona y empeorar las afecciones existentes. En los nios, puede provocar diarrea y convulsiones.  Cada ao miles de personas mueren en los [place] debido a la gripe y muchos ms deben ser hospitalizados.  La vacuna contra la gripe es la mejor proteccin que existe contra la gripe y sus complicaciones. La vacuna contra la gripe tambin ayuda a prevenir la propagacin de la gripe de una persona a otra.  VACUNA INACTIVADA CONTRA LA GRIPE  Hay dos tipos de vacunas contra la gripe:   Usted recibir la vacuna de la gripe inactivada, que no contiene virus vivo. Se administra en forma de inyeccin con una aguja y se llama la "vacuna antigripal".  Otro tipo de vacuna con virus vivos,  atenuados (debilitados), se aplica en forma de aerosol en las fosas nasales. Esta vacuna se describe en el apartado Informacin sobre las vacunas. Se recomienda aplicarse la vacuna contra la gripe todos los aos. Los nios entre los 6 meses y los 8 aos de edad deben recibir 2 dosis el primer ao que se vacunen.  Los virus de la gripe cambian constantemente. Cada ao, la vacuna contra la gripe se actualiza para proteger contra los virus que tienen ms probabilidades de causar la enfermedad ese ao. Aunque la vacuna no puede prevenir todos los casos de gripe, es nuestra mejor defensa contra la enfermedad. Vacuna contra la gripe inactivada protege contra 3 o 4 virus diferentes.  Se tarda aproximadamente 2 semanas para desarrollar la proteccin despus de la vacunacin y la proteccin dura entre algunos meses y un ao.  Muchas veces se confunden con la gripe algunas enfermedades que no son causadas por el virus de la gripe. La vacuna contra la gripe no previene estas enfermedades. Slo se puede prevenir la gripe.  Para las personas de ms de 65 aos, se dispone de una vacuna contra la gripe de "dosis elevada". La persona que aplica la vacuna puede darle ms informacin al respecto.  Algunas de las vacunas contra la gripe inactivada contienen una cantidad muy pequea de un conservante a base de mercurio llamado timerosal. Algunos estudios han demostrado que el timerosal en las vacunas no es perjudicial, pero se dispone de vacunas contra la gripe que no contienen el conservante.  ALGUNAS PERSONAS NO DEBEN RECIBIR ESTA VACUNA Informe a la   persona que le aplica la vacuna:   Si sufre alguna alergia grave (que pone en peligro la vida). Si alguna vez tuvo una reaccin alrgica potencialmente mortal despus de una dosis de la vacuna contra la gripe, o tuvo una alergia grave a cualquiera de los componentes de esta vacuna, es posible que se le recomiende no recibir una dosis. La mayora de las vacunas contra la gripe,  aunque no todas, contienen una pequea cantidad de huevo.  Si alguna vez ha sufrido el sndrome de Guillain-Barr (una enfermedad paralizante grave tambin llamada GBS). Algunas personas con antecedentes de GBS no deben recibir esta vacuna. Debe comentarlo con su mdico.  Si no se siente bien. Podran sugerirle que espere hasta sentirse mejor. Pero debe volver. RIESGOS DE UNA REACCIN A LA VACUNA Con la vacuna, como cualquier medicamento, existe la posibilidad de sufrir efectos secundarios. Suelen ser leves y desaparecen por s solos.  Los efectos secundarios graves son tambin posibles, pero son muy raros. Vacuna de la gripe inactivada no contiene el virus vivo de la gripe, la gripe por lo tanto enfermarse por recibir la vacuna no es posible.  Episodios de desmayo leves y sntomas relacionados (tales como sacudidas) pueden presentarse despus de cualquier procedimiento mdico, incluyendo la vacunacin. Si permanece sentado o recostado durante 15 minutos despus de la vacunacin puede ayudar a evitar los desmayos y las lesiones causadas por las cadas. Informe al mdico si se siente mareado o aturdido, tiene cambios en la visin o zumbidos en los odos.  Problemas leves luego de recibir la vacuna de la gripe inactivada:   Puede haber dolor, enrojecimiento o hinchazn en el lugar en el que le aplicaron la vacuna.  Ronquera; dolor, inflamacin o picazn en los ojos o tos.  Fiebre.  Dolores.  Dolor de cabeza.  Picazn.  Fatiga. Si estos problemas ocurren, en general comienzan poco despus de vacunarse y duran 1  2 das.  Problemas moderados luego de recibir la vacuna de la gripe inactivada:   Los nios que reciben la vacuna contra la gripe inactivada y la vacuna antineumoccica (PCV13) al mismo tiempo, pueden tener un mayor riesgo de sufrir convulsiones causadas por fiebre. Consulte a su mdico para obtener ms informacin. Informe a su mdico si un nio que est recibiendo la vacuna contra  la gripe ha tenido una convulsin. Problemas graves luego de recibir la vacuna inactivada contra la gripe:   Una reaccin alrgica grave puede ocurrir despus de la administracin de cualquier vacuna (se estima en menos de 1 en un milln de dosis).  Hay una pequea posibilidad de que la vacuna de la gripe inactivada est asociada con el sndrome de Guillain-Barr (GBS), no ms de 1 o 2 casos por milln de personas vacunadas. Es mucho menor que el riesgo de sufrir complicaciones graves por la gripe, que puede prevenirse con la vacunacin. Se controla permanentemente la seguridad de las vacunas. Para obtener ms informacin, consulte www.cdc.gov vaccinesafety/  QU PASA SI HAY UNA REACCIN GRAVE?  Qu signos debo buscar?   Observe todo lo que le preocupe, como signos de una reaccin alrgica grave, fiebre muy alta o cambios en el comportamiento. Los signos de una reaccin alrgica grave pueden incluir urticaria, hinchazn de la cara y la garganta, dificultad para respirar, ritmo cardaco acelerado, mareos y debilidad. Pueden comenzar entre unos pocos minutos y algunas horas despus de la vacunacin.  Qu debo hacer?   Si usted piensa que se trata de una reaccin alrgica grave o de otra emergencia   que no puede esperar, llame al 911 o lleve a la persona al hospital ms cercano. De lo contrario, llame a su mdico.  Despus, la reaccin debe informarse a la "Vaccine Adverse Event Reporting System" (Sistema de informacin sobre efectos adversos de las vacunas -VAERS). Su mdico puede presentar este informe, o puede hacerlo usted mismo a travs del sitio web de VAERS, en www.vaers.hhs.gov, o llamando al 1-800-822-7967. VAERS es slo para informar reacciones. No brindan consejo mdico.  PROGRAMA NACIONAL DE COMPENSACIN DE DAOS POR VACUNAS  El National Vaccine Injury Compensation Program (VICP) es un programa federal que fue creado para compensar a las personas que puedan haber sufrido daos al  recibir ciertas vacunas.  Aquellas personas que consideren que han sufrido un dao como consecuencia de una vacuna y quieren saber ms acerca del programa y como presentar una denuncia, pueden llamar al 1-800-338-2382 o visitar su sitio web en www.hrsa.gov/vaccinecompensation.  CMO PUEDO OBTENER MS INFORMACIN?   Consulte a su mdico.  Comunquese con el servicio de salud de su localidad o su estado.  Comunquese con los Centros para el control y la prevencin de enfermedades (Centers for Disease Control and Prevention , CDC).  Llame al 1-800-232-4636 (1-800-CDC-INFO) o  Visite la pgina web de los CDC en www.cdc.gov/flu. CDC Inactivated Influenza Vaccine Interim VIS (05/28/12)  Document Released: 01/16/2009 Document Revised: 07/14/2012 [employer] Patient Information 2014 [employer].  

## 2013-10-07 ENCOUNTER — Encounter: Payer: Self-pay | Admitting: Gynecology

## 2014-02-13 ENCOUNTER — Ambulatory Visit: Payer: Self-pay | Admitting: Gynecology

## 2014-02-17 ENCOUNTER — Encounter: Payer: Self-pay | Admitting: Gynecology

## 2014-02-17 ENCOUNTER — Ambulatory Visit (INDEPENDENT_AMBULATORY_CARE_PROVIDER_SITE_OTHER): Payer: Self-pay | Admitting: Gynecology

## 2014-02-17 VITALS — BP 100/60

## 2014-02-17 DIAGNOSIS — N949 Unspecified condition associated with female genital organs and menstrual cycle: Secondary | ICD-10-CM

## 2014-02-17 DIAGNOSIS — L290 Pruritus ani: Secondary | ICD-10-CM

## 2014-02-17 DIAGNOSIS — R102 Pelvic and perineal pain: Secondary | ICD-10-CM

## 2014-02-17 NOTE — Patient Instructions (Signed)
Informacin sobre los anticonceptivos orales (Oral Contraception Information) Los anticonceptivos orales (ACO) son medicamentos que se utilizan para evitar el embarazo. Su funcin es evitar que los ovarios liberen vulos. Las hormonas de los ACO tambin hacen que el moco cervical se haga ms espeso, lo que evita que el esperma ingrese al tero. Tambin hacen que la membrana que recubre internamente al tero se vuelva ms fina, lo que no permite que el huevo fertilizado se adhiera a la pared del tero. Los ACO son muy efectivos cuando se toman exactamente como se prescriben. Sin embargo, no previenen contra las enfermedades de transmisin sexual (ETS). La prctica del sexo seguro, como el uso de preservativos, junto con la pldora, ayudan a prevenir ese tipo de enfermedades.  Antes de tomar la pldora, usted debe hacerse un examen fsico y un test de Pap. El mdico podr indicarle anlisis de sangre, si es necesario. El mdico se asegurar de que usted sea una buena candidata para usar anticonceptivos orales. Converse con su mdico acerca de los posibles efectos secundarios de los ACO que podran recetarle. Cuando se inicia el uso de ACO, puede llevar 2 a 3 meses para que su organismo se adapte a los cambios en los niveles hormonales.  TIPOS DE ANTICONCEPTIVOS ORALES  Pldora combinada: esta pldora contiene las hormonas estrgeno y progestina (progesterona sinttica). La pldora combinada viene en envases para 21 das, 28 das o 91 das. Algunos tipos de pldoras combinadas deben tomarse de manera continua (pldoras para 365 das). En los envases para 21 das, usted no tomar las pldoras durante 7 das despus de la ltima pldora. En los envases para 28 das, la pldora se toma todos los das. Las ltimas 7 no contienen hormonas. Ciertos tipos de pldoras tienen ms de 21 pldoras que contienen hormonas. En los envases para 91 das, las primeras 84 pldoras contienen ambas hormonas y las ltimas 7 pldoras  no contienen hormonas o contienen slo estrgenos.  La minipldora: esta pldora contiene la hormona progesterona solamente. Es necesario tomarla todos los das de manera continua. Es importante que las tome a la misma hora todos los das. Viene en envases de 28 pldoras. Las 28 pldoras contienen la hormona.  VENTAJAS DE LOS ANTICONCEPTIVOS ORALES  Disminuye los sntomas premenstruales.   Se usa para tratar los clicos menstruales.   Regula el ciclo menstrual.   Disminuye el ciclo menstrual abundante.   Puede mejorar el acn, segn el tipo de pldora.   Trata hemorragias uterinas anormales.   Trata el sndrome ovrico poliqustico.   Trata la endometriosis.   Pueden usarse como anticonceptivo de emergencia.  FACTORES QUE PUEDEN HACER QUE LOS ANTICONCEPTIVOS ORALES SEAN MENOS EFECTIVOS Pueden ser menos efectivos si:   Olvid tomar la pldora todos los das a la misma hora.   Tiene una enfermedad estomacal o intestinal que disminuye la absorcin de la pldora.   Ingiere simultneamente los anticonceptivos orales junto con otros medicamentos que los hacen menos efectivos, como antibiticos, ciertos medicamentos para el VIH y algunos medicamentos para las convulsiones.   Usted toma anticonceptivos orales que han vencido.   Cuando se usa el envase de 21 das, se olvida de recomenzar el uso en el da 7.  RIESGOS ASOCIADOS AL USO DE ANTICONCEPTIVOS ORALES  Los anticonceptivos orales pueden en algunos casos causar efectos secundarios como:  Dolor de cabeza.  Nuseas.  Inflamacin mamaria.  Hemorragia vaginal o manchado irregular. Las pldoras combinadas tambin se asocian a un pequeo aumento en el riesgo de:  Cogulos   sanguneos.  Ataque cardaco.  Ictus. Document Released: 07/30/2005 Document Revised: 08/10/2013 [facility] Patient Information 2014 [place], [place].

## 2014-02-17 NOTE — Progress Notes (Addendum)
   Patient presented today stating that she has stopped her birth control pill approximately 6 weeks ago. She has had intermittent left lower quadrant port and like sensation and very short-lived. She is having normal bowel movements. She states that occasionally she'll feel some itching in her anus. [place] recently came [place] from [place] weeks ago. She is having normal menstrual cycles currently menstruating. Patient had questions of polyps she could stay on oral contraceptive pill.  Exam: Abdomen: Soft nontender no rebound or guarding Pelvic: The urethra Skene was within normal limits Vagina: Menstrual blood present Cervix: Menstrual blood present Uterus: Anteverted normal size shape and consistency Adnexa: No probable masses or tenderness Rectal exam: Anoscopic exam was undertaken with the patient in the knee-chest position and there was no evidence of internal or external hemorrhoid or any fissure.  Assessment/plan: This patient has been overseas we're going to order a stool for ova and parasite. She will start her birth control pills again this Sunday. The risks benefits and pros and cons were discussed and information provided in Spanish. She will monitor her diet to see if this contributes to her anal pruritus.   Patient brought her labs from [place] which indicated she had a normal CBC, basic metabolic panel, fasting lipid profile, urinalysis some blood were noted the patient stated that she was treated for urinary tract infection since also there was leukocytosis present as well she is asymptomatic today.

## 2014-02-20 ENCOUNTER — Other Ambulatory Visit: Payer: Self-pay

## 2014-02-20 DIAGNOSIS — Z1231 Encounter for screening mammogram for malignant neoplasm of breast: Secondary | ICD-10-CM

## 2014-02-21 LAB — OVA AND PARASITE EXAMINATION: OP: NONE SEEN

## 2014-02-22 ENCOUNTER — Ambulatory Visit
Admission: RE | Admit: 2014-02-22 | Discharge: 2014-02-22 | Disposition: A | Discharge status: Home / Self Care | Payer: Self-pay | Source: Ambulatory Visit

## 2014-02-22 DIAGNOSIS — Z1231 Encounter for screening mammogram for malignant neoplasm of breast: Secondary | ICD-10-CM

## 2014-09-04 ENCOUNTER — Encounter: Payer: Self-pay | Admitting: Gynecology

## 2014-10-12 ENCOUNTER — Other Ambulatory Visit: Payer: Self-pay | Admitting: Gynecology

## 2014-11-20 ENCOUNTER — Encounter: Payer: Self-pay | Admitting: Gynecology

## 2014-11-20 ENCOUNTER — Other Ambulatory Visit (HOSPITAL_COMMUNITY)
Admission: RE | Admit: 2014-11-20 | Discharge: 2014-11-20 | Disposition: A | Discharge status: Home / Self Care | Payer: No Typology Code available for payment source | Source: Ambulatory Visit | Attending: Gynecology | Admitting: Gynecology

## 2014-11-20 ENCOUNTER — Ambulatory Visit (INDEPENDENT_AMBULATORY_CARE_PROVIDER_SITE_OTHER): Payer: Self-pay | Admitting: Gynecology

## 2014-11-20 VITALS — BP 116/78 | Ht 63.25 in | Wt 129.0 lb

## 2014-11-20 DIAGNOSIS — N898 Other specified noninflammatory disorders of vagina: Secondary | ICD-10-CM

## 2014-11-20 DIAGNOSIS — Z01419 Encounter for gynecological examination (general) (routine) without abnormal findings: Secondary | ICD-10-CM

## 2014-11-20 DIAGNOSIS — N760 Acute vaginitis: Secondary | ICD-10-CM

## 2014-11-20 DIAGNOSIS — Z113 Encounter for screening for infections with a predominantly sexual mode of transmission: Secondary | ICD-10-CM

## 2014-11-20 DIAGNOSIS — B9689 Other specified bacterial agents as the cause of diseases classified elsewhere: Secondary | ICD-10-CM

## 2014-11-20 DIAGNOSIS — Z1151 Encounter for screening for human papillomavirus (HPV): Secondary | ICD-10-CM | POA: Insufficient documentation

## 2014-11-20 DIAGNOSIS — A499 Bacterial infection, unspecified: Secondary | ICD-10-CM

## 2014-11-20 LAB — WET PREP FOR TRICH, YEAST, CLUE
Trich, Wet Prep: NONE SEEN
WBC WET PREP: NONE SEEN
Yeast Wet Prep HPF POC: NONE SEEN

## 2014-11-20 MED ORDER — METRONIDAZOLE 500 MG PO TABS: 500.0000 mg | ORAL_TABLET | Freq: Two times a day (BID) | ORAL | Status: DC

## 2014-11-20 MED ORDER — LEVONORGESTREL-ETHINYL ESTRAD 0.1-20 MG-MCG PO TABS: 1.0000 | ORAL_TABLET | Freq: Every day | ORAL | Status: DC

## 2014-11-20 NOTE — Patient Instructions (Addendum)
aginosis bacteriana (Bacterial Vaginosis) La vaginosis bacteriana es una infeccin de la vagina. Se produce cuando una cantidad excesiva de ciertos grmenes (bacterias) crece en la vagina. [place] los medicamentos tal como se lo indic su mdico.  Finalice la prescripcin completa, aunque comience a sentirse mejor.  No mantenga relaciones sexuales hasta que finalice sus medicamentos y se International aid/development worker.  Comunique a sus compaeros sexuales que sufre una infeccin. Deben consultar a su mdico para iniciar un tratamiento.  Practique el sexo seguro. Use preservativos. Tenga solo un compaero sexual. SOLICITE AYUDA SI:  No mejora luego de 3 das de [place].  Observa una secrecin (prdida) de color gris ms abundante que proviene de la vagina.  Siente ms dolor que antes.  Tiene fiebre. ASEGRESE DE QUE:   Comprende estas instrucciones.  Controlar su afeccin.  Recibir ayuda de inmediato si no mejora o si empeora. Document Released: 01/16/2009 Document Revised: 08/10/2013 [facility] Patient Information 2015 [place]. This information is not intended to replace advice given to you by your health care provider. Make sure you discuss any questions you have with your health care provider.\  Metronidazole tablets or capsules Qu es este medicamento? El METRONIDAZOL es un antiinfeccioso. Se utiliza en el tratamiento de ciertos tipos de infecciones bacterianas y por protozoos. No es efectivo para resfros, gripe u otras infecciones de origen viral. Este medicamento puede ser utilizado para otros usos; si tiene alguna pregunta consulte con su proveedor de atencin mdica o con su farmacutico. MARCAS COMERCIALES DISPONIBLES: Flagyl Qu le debo informar a mi profesional de la salud antes de tomar este medicamento? Necesita saber si usted presenta alguno de los siguientes problemas o situaciones: -anemia u otros trastornos sanguneos -enfermedad del sistema  nervioso -infeccin mictica o por levadura -si consume bebidas alcohlicas -enfermedad heptica -convulsiones -una reaccin alrgica o inusual al metronidazol, a otros medicamentos, alimentos, colorantes o conservantes -si est embarazada o buscando quedar embarazada -si est amamantando a un beb Cmo debo utilizar este medicamento? Tome este medicamento por va oral con un vaso lleno de agua. Siga las instrucciones de la etiqueta del [place]. Tome sus dosis a intervalos regulares. No tome su medicamento con una frecuencia mayor a la indicada. Complete todo el tratamiento con el medicamento como recetado aun si se siente mejor. No omita ninguna dosis o suspenda el uso de su medicamento antes de lo indicado. Hable con su pediatra para informarse acerca del uso de este medicamento en nios. Puede requerir atencin especial. Sobredosis: Pngase en contacto inmediatamente con un centro toxicolgico o una sala de urgencia si usted cree que haya tomado demasiado medicamento. ATENCIN: [employer] es solo para usted. No comparta este medicamento con nadie. Qu sucede si me olvido de una dosis? Si olvida una dosis, tmela lo antes posible. Si es casi la hora de la prxima dosis, tome slo esa dosis. No tome dosis adicionales o dobles. Qu puede interactuar con este medicamento? No tome esta medicina con ninguno de los siguientes medicamentos: -alcohol o cualquier producto que contiene alcohol -solucin oral de amprenavir -cisapride -disulfiram -dofetilida -dronedarona -inyeccin de paclitaxel -pimozida -solucin oral de ritonavir -solucin oral de sertralina -inyeccin de sulfametoxasol-trimetoprima -tioridazina -ziprasidona Esta medicina tambin puede interactuar con los siguientes medicamentos: -pldoras anticonceptivas -cimetidina -litio -otros medicamentos que prolongan el intervalo QT (provoca un ritmo cardiaco anormal) -fenobarbital -fenitona -warfarina Puede ser  que esta lista no menciona todas las posibles interacciones. Informe a su profesional de la salud de [employer] productos a base  de hierbas, medicamentos de venta libre o suplementos nutritivos que est tomando. Si usted fuma, consume bebidas alcohlicas o si utiliza drogas ilegales, indqueselo tambin a su profesional de [employer]	[place]. Algunas sustancias pueden interactuar con su medicamento. A qu debo estar atento al usar [employer]? Consulte a su mdico o a su profesional de la salud si sus sntomas no mejoran o si empeoran. Puede experimentar mareos o somnolencia. No conduzca ni utilice maquinaria ni haga nada que Associate Professor en estado de alerta hasta que sepa cmo le afecta este medicamento. No se siente ni se ponga de pie con rapidez, especialmente si es un paciente de edad avanzada. Esto reduce el riesgo de mareos o [employer]. Evite las bebidas alcohlicas durante el tratamiento con este medicamento y [employer] tres das siguientes. El alcohol puede causarle mareos o hacerlo sentir enfermo o ruborizado. Si est recibiendo tratamiento para una enfermedad de transmisin sexual, no tenga relaciones sexuales hasta que haya completado el [place]. Es posible que su pareja tambin necesite [place]. Qu efectos secundarios puedo tener al [employer] este medicamento? Efectos secundarios que debe informar a su mdico o a Barrister's clerk de la salud tan pronto como sea posible: -Chief of Staff como erupcin cutnea o urticarias, hinchazn de la cara, labios o lengua -confusin, torpeza -dificultad para hablar -mareos -decoloracin o dolor de la boca -fiebre, infeccin -entumecimiento, hormigueo, dolor o debilidad en las manos o los pies -dificultad para orinar o cambios en el volumen de orina -enrojecimiento, formacin de ampollas, descamacin o distensin de la piel, inclusive dentro de la boca -convulsiones -cansancio o debilidad inusual -irritacin, resequedad o flujo de la  vagina Efectos secundarios que, por lo general, no requieren atencin mdica (debe informarlos a su mdico o a su profesional de la salud si persisten o si son molestos): -diarrea -dolor de cabeza -irritabilidad -sabor metlico -nuseas -calambres o dolores estomacales -dificultad para conciliar el sueo Puede ser que esta lista no menciona todos los posibles efectos secundarios. Comunquese a su mdico por asesoramiento mdico [employer]. Usted puede informar los efectos secundarios a la FDA por telfono al 1-800-FDA-1088. Dnde debo guardar mi medicina? Mantngala fuera del alcance de los nios. Gurdela a [employer], menos de 25 grados C (77 grados F). Protjala de la luz. Mantenga el envase bien cerrado. Deseche todo el medicamento que no haya utilizado, despus de la fecha de vencimiento. ATENCIN: Este folleto es un resumen. Puede ser que no cubra toda la posible informacin. Si usted tiene preguntas acerca de esta medicina, consulte con su mdico, su farmacutico o su profesional de Technical sales engineer.  2015, Elsevier/Gold Standard. (2013-08-22 17:54:08)

## 2014-11-20 NOTE — Progress Notes (Signed)
Laura Short 06/25/1971 [id number]   History:    44 y.o.  for annual gyn exam complaining of 2 weeks of a brownish thick discharge with no pruritus. Patient currently on oral contraceptive pills and had been having normal cycles.Patient with history several years ago in [place] she had a laparoscopic ovarian cystectomy for which we have no documentation. She also has a history of saline breast augmentation in the past.Patient's sister had breast cancer at the age of 19 and also a cousin as well. One cousin also had ovarian cancer. Review of her record also indicated in the past and she had been offered BRCA 1 BRCA2 testing and literature and information been provided. Due to limited financial resources she has not had this done. Patient refused the flu vaccine today. Patient requesting full STD screening today.  Past medical history,surgical history, family history and social history were all reviewed and documented in the EPIC chart.  Gynecologic History Patient's last menstrual period was 10/31/2014. Contraception: OCP (estrogen/progesterone) Last Pap: 2012. Results were: normal Last mammogram: 2015. Results were: normal  Obstetric History OB History  Gravida Para Term Preterm AB SAB TAB Ectopic Multiple Living  2 2 2       2     # Outcome Date GA Lbr Len/2nd Weight Sex Delivery Anes PTL Lv  2 Term     F Vag-Spont   Y  1 Term     M Vag-Spont   Y       ROS: A ROS was performed and pertinent positives and negatives are included in the history.  GENERAL: No fevers or chills. HEENT: No change in vision, no earache, sore throat or sinus congestion. NECK: No pain or stiffness. CARDIOVASCULAR: No chest pain or pressure. No palpitations. PULMONARY: No shortness of breath, cough or wheeze. GASTROINTESTINAL: No abdominal pain, nausea, vomiting or diarrhea, melena or bright red blood per rectum. GENITOURINARY: No urinary frequency, urgency, hesitancy or dysuria. MUSCULOSKELETAL: No joint or  muscle pain, no back pain, no recent trauma. DERMATOLOGIC: No rash, no itching, no lesions. ENDOCRINE: No polyuria, polydipsia, no heat or cold intolerance. No recent change in weight. HEMATOLOGICAL: No anemia or easy bruising or bleeding. NEUROLOGIC: No headache, seizures, numbness, tingling or weakness. PSYCHIATRIC: No depression, no loss of interest in normal activity or change in sleep pattern.     Exam: chaperone present  BP 116/78 mmHg  Ht 5' 3.25" (1.607 m)  Wt 129 lb (58.514 kg)  BMI 22.66 kg/m2  LMP 10/31/2014  Body mass index is 22.66 kg/(m^2).  General appearance : Well developed well nourished female. No acute distress HEENT: Neck supple, trachea midline, no carotid bruits, no thyroidmegaly Lungs: Clear to auscultation, no rhonchi or wheezes, or rib retractions  Heart: Regular rate and rhythm, no murmurs or gallops Breast:Examined in sitting and supine position were symmetrical in appearance, no palpable masses or tenderness,  no skin retraction, no nipple inversion, no nipple discharge, no skin discoloration, no axillary or supraclavicular lymphadenopathy Abdomen: no palpable masses or tenderness, no rebound or guarding Extremities: no edema or skin discoloration or tenderness  Pelvic:  Bartholin, Urethra, Skene Glands: Within normal limits             Vagina: No gross lesions or discharge, some menstrual blood present  Cervix: No gross lesions or discharge  Uterus  anteverted, normal size, shape and consistency, non-tender and mobile  Adnexa  Without masses or tenderness  Anus and perineum  normal   Rectovaginal  normal  sphincter tone without palpated masses or tenderness             Hemoccult not indicated   Wet prep: Few clue cells tumors to count bacteria  GC and Chlamydia culture pending  Assessment/Plan:  44 y.o. female for annual exam with clinical evidence of bacterial vaginosis. Patient will be placed on Flagyl 500 mg one by mouth twice a day for 7 days. GC  and Chlamydia culture was obtained. Patient will return back to the office tomorrow in a fasting state for the following labs: Fasting lipid profile, compresses a metabolic panel, TSH, CBC, and urinalysis. At the same time we will complete the STD screening to include the following: HIV, RPR, hepatitis B and C. Patient was reminded of the importance of monthly breast exam. Her Pap smear was done today. Prescription refill for oral contraceptive pill was provided.   Terrance Mass MD, 11:55 AM 11/20/2014

## 2014-11-21 ENCOUNTER — Other Ambulatory Visit: Payer: Self-pay

## 2014-11-21 LAB — URINALYSIS W MICROSCOPIC + REFLEX CULTURE
Bilirubin Urine: NEGATIVE
CRYSTALS: NONE SEEN
Casts: NONE SEEN
GLUCOSE, UA: NEGATIVE mg/dL
KETONES UR: NEGATIVE mg/dL
Leukocytes, UA: NEGATIVE
Nitrite: NEGATIVE
PH: 5 (ref 5.0–8.0)
PROTEIN: NEGATIVE mg/dL
Specific Gravity, Urine: 1.019 (ref 1.005–1.030)
UROBILINOGEN UA: 0.2 mg/dL (ref 0.0–1.0)

## 2014-11-21 LAB — CBC WITH DIFFERENTIAL/PLATELET
Basophils Absolute: 0 10*3/uL (ref 0.0–0.1)
Basophils Relative: 0 % (ref 0–1)
EOS PCT: 1 % (ref 0–5)
Eosinophils Absolute: 0 10*3/uL (ref 0.0–0.7)
HEMATOCRIT: 39.2 % (ref 36.0–46.0)
Hemoglobin: 13.1 g/dL (ref 12.0–15.0)
LYMPHS PCT: 41 % (ref 12–46)
Lymphs Abs: 1.8 10*3/uL (ref 0.7–4.0)
MCH: 29.8 pg (ref 26.0–34.0)
MCHC: 33.4 g/dL (ref 30.0–36.0)
MCV: 89.3 fL (ref 78.0–100.0)
MONO ABS: 0.3 10*3/uL (ref 0.1–1.0)
MPV: 9.6 fL (ref 8.6–12.4)
Monocytes Relative: 7 % (ref 3–12)
NEUTROS ABS: 2.3 10*3/uL (ref 1.7–7.7)
NEUTROS PCT: 51 % (ref 43–77)
PLATELETS: 301 10*3/uL (ref 150–400)
RBC: 4.39 MIL/uL (ref 3.87–5.11)
RDW: 13 % (ref 11.5–15.5)
WBC: 4.5 10*3/uL (ref 4.0–10.5)

## 2014-11-21 LAB — COMPREHENSIVE METABOLIC PANEL
ALBUMIN: 4.1 g/dL (ref 3.5–5.2)
ALK PHOS: 50 U/L (ref 39–117)
ALT: 19 U/L (ref 0–35)
AST: 21 U/L (ref 0–37)
BUN: 14 mg/dL (ref 6–23)
CO2: 23 meq/L (ref 19–32)
CREATININE: 0.84 mg/dL (ref 0.50–1.10)
Calcium: 9.6 mg/dL (ref 8.4–10.5)
Chloride: 106 mEq/L (ref 96–112)
GLUCOSE: 84 mg/dL (ref 70–99)
Potassium: 5.1 mEq/L (ref 3.5–5.3)
SODIUM: 139 meq/L (ref 135–145)
TOTAL PROTEIN: 7.1 g/dL (ref 6.0–8.3)
Total Bilirubin: 0.7 mg/dL (ref 0.2–1.2)

## 2014-11-21 LAB — LIPID PANEL
Cholesterol: 170 mg/dL (ref 0–200)
HDL: 86 mg/dL (ref >39)
LDL CALC: 69 mg/dL (ref 0–99)
TRIGLYCERIDES: 73 mg/dL (ref <150)
Total CHOL/HDL Ratio: 2 Ratio
VLDL: 15 mg/dL (ref 0–40)

## 2014-11-21 LAB — TSH: TSH: 0.946 u[IU]/mL (ref 0.350–4.500)

## 2014-11-21 LAB — GC/CHLAMYDIA PROBE AMP
CT Probe RNA: NEGATIVE
GC PROBE AMP APTIMA: NEGATIVE

## 2014-11-22 ENCOUNTER — Telehealth: Payer: Self-pay | Admitting: *Deleted

## 2014-11-22 LAB — CYTOLOGY - PAP

## 2014-11-22 LAB — HEPATITIS B SURFACE ANTIGEN: HEP B S AG: NEGATIVE

## 2014-11-22 LAB — HIV ANTIBODY (ROUTINE TESTING W REFLEX): HIV: NONREACTIVE

## 2014-11-22 LAB — HEPATITIS C ANTIBODY: HCV Ab: NEGATIVE

## 2014-11-22 LAB — RPR

## 2014-11-22 NOTE — Telephone Encounter (Signed)
Taking a different times

## 2014-11-22 NOTE — Telephone Encounter (Signed)
If you take the flagy at breakfast and dinner she can take the macrobid at lunch and bedtime

## 2014-11-22 NOTE — Telephone Encounter (Signed)
Dr.Fernandez pt would like to know what do you mean by different times? Finish flagyl first then start Macrobid? Please advise

## 2014-11-22 NOTE — Telephone Encounter (Signed)
Pt informed with the below, she was prescribed flagyl 500 mg x 7 days at OV 11/20/13 as well. Pt asked if okay to take both medication at same time? Please advise

## 2014-11-22 NOTE — Telephone Encounter (Signed)
-----   Message from Ok EdwardsJuan H Fernandez, MD sent at 11/22/2014  1:59 PM EST ----- Please inform patient that her urinalysis and culture demonstrated she has urinary tract infection. Please call in Macrobid one by mouth twice a day for 7 days

## 2014-11-22 NOTE — Telephone Encounter (Signed)
Unable to leave a message on her voicemail to inform pt with the below.

## 2014-11-23 LAB — URINE CULTURE: Colony Count: >=100000

## 2015-07-30 ENCOUNTER — Encounter: Payer: Self-pay | Admitting: Gynecology

## 2015-07-30 ENCOUNTER — Ambulatory Visit (INDEPENDENT_AMBULATORY_CARE_PROVIDER_SITE_OTHER): Payer: Self-pay | Admitting: Gynecology

## 2015-07-30 VITALS — BP 112/70

## 2015-07-30 DIAGNOSIS — N921 Excessive and frequent menstruation with irregular cycle: Secondary | ICD-10-CM

## 2015-07-30 NOTE — Progress Notes (Signed)
   Patient is a 44 year old who presented to the office today stating that for the past 3 months blood for 3-5 days after her regular menses. This has occurred for 3 months. She states that she has had good compliance. She had a normal Pap smear this January. She denies any dyspareunia. No GU or GI complaints.  Exam: Abdomen: Soft nontender no rebound or guarding Pelvic: Bartholin urethra Skene glands within normal limits Vagina: Dark brown blood noted in the vaginal vault but no lesions seen Cervix: Same as above Uterus: Anteverted normal size shape and consistency Adnexa: No palpable masses or tenderness Rectal exam: Not done  Assessment/plan: Breakthrough bleeding on oral contraception pill. Patient will be instructed to discontinue the oral contraception for 3 months and maintain a menstrual calendar. She is going to her home country of [place] in December if she continues with irregular  bleeding she is going to have an endometrial biopsy and sonohysterogram there and then to see me in January. She will use barrier contraception.

## 2015-08-20 ENCOUNTER — Other Ambulatory Visit: Payer: Self-pay

## 2015-08-20 DIAGNOSIS — Z1231 Encounter for screening mammogram for malignant neoplasm of breast: Secondary | ICD-10-CM

## 2015-09-19 ENCOUNTER — Ambulatory Visit
Admission: RE | Admit: 2015-09-19 | Discharge: 2015-09-19 | Disposition: A | Discharge status: Home / Self Care | Payer: No Typology Code available for payment source | Source: Ambulatory Visit

## 2015-09-19 DIAGNOSIS — Z1231 Encounter for screening mammogram for malignant neoplasm of breast: Secondary | ICD-10-CM

## 2015-11-27 ENCOUNTER — Ambulatory Visit (INDEPENDENT_AMBULATORY_CARE_PROVIDER_SITE_OTHER): Payer: Self-pay | Admitting: Gynecology

## 2015-11-27 ENCOUNTER — Encounter: Payer: Self-pay | Admitting: Gynecology

## 2015-11-27 VITALS — BP 112/70 | Ht 63.25 in | Wt 129.0 lb

## 2015-11-27 DIAGNOSIS — Z01419 Encounter for gynecological examination (general) (routine) without abnormal findings: Secondary | ICD-10-CM

## 2015-11-27 NOTE — Patient Instructions (Signed)
Perimenopausia  (Perimenopause)  La perimenopausia es el momento en que su cuerpo comienza a pasar a la menopausia (sin menstruación durante 12 meses consecutivos). Es un proceso natural. La perimenopausia puede comenzar entre 2 y 8 años antes de la menopausia y por lo general tiene una duración de 1 año más pasada la menopausia. Durante este tiempo, los ovarios podrían producir un óvulo o no. Los ovarios varían su producción de las hormonas estrógeno y progesterona cada mes. Esto puede causar períodos menstruales irregulares, dificultad para quedar embarazada, hemorragia vaginal entre períodos y síntomas incómodos.  CAUSAS  · Producción irregular de las hormonas ováricas estrógeno y progesterona, y no ovular todos los meses.  · Otras causas son:    Tumor de la glándula pituitaria.    Enfermedades que afectan los ovarios.    Radioterapia.    Quimioterapia.    Causas desconocidas.    Fumar mucho y abusar del consumo de alcohol puede llevar a que la perimenopausia aparezca antes.  SIGNOS Y SÍNTOMAS   · Acaloramiento.  · Sudoración nocturna.  · Períodos menstruales irregulares.  · Disminución del deseo sexual.  · Sequedad vaginal.  · Dolores de cabeza.  · Cambios en el estado de ánimo.  · Depresión.  · Problemas de memoria.  · Irritabilidad.  · Cansancio.  · Aumento de peso.  · Problemas para quedar embarazada.  · Pérdida de células óseas (osteoporosis).  · Comienzo de endurecimiento de las arterias (aterosclerosis).  DIAGNÓSTICO   El médico realizará un diagnóstico en función de su edad, historial de períodos menstruales y síntomas. Le realizarán un examen físico para ver si hay algún cambio en su cuerpo, en especial en sus órganos reproductores. Las pruebas hormonales pueden ser o no útiles según la cantidad de hormonas femeninas que produzca y cuándo las produzca. Sin embargo, podrán realizarse otras pruebas hormonales para detectar otros problemas.  TRATAMIENTO   En algunos casos, no se necesita tratamiento. La  decisión acerca de qué tratamiento es necesario durante la perimenopausia deberá realizarse en conjunto con su médico según cómo estén afectando los síntomas a su estilo de vida. Existen varios tratamientos disponibles, como:  · Tratar cada síntoma individual con medicamentos específicos para ese síntoma.  · Algunos medicamentos herbales pueden ayudar en síntomas específicos.  · Psicoterapia.  · Terapia grupal.  INSTRUCCIONES PARA EL CUIDADO EN EL HOGAR   · Controle sus periodos menstruales (cuándo ocurren, qué tan abundantes son, cuánto tiempo pasa entre períodos, y cuánto duran) como también sus síntomas y cuándo comenzaron.  · Tome sólo medicamentos de venta libre o recetados, según las indicaciones del médico.  · Duerma y descanse.  · Haga actividad física.  · Consuma una dieta que contenga calcio (bueno para los huesos) y productos derivados de la soja (actúan como estrógenos).  · No fume.  · Evite las bebidas alcohólicas.  · Tome los suplementos vitamínicos según las indicaciones del médico. En ciertos casos, puede ser de ayuda tomar vitamina E.  · Tome suplementos de calcio y vitamina D para ayudar a prevenir la pérdida ósea.  · En algunos casos la terapia de grupo podrá ayudarla.  · La acupuntura puede ser de ayuda en ciertos casos.  SOLICITE ATENCIÓN MÉDICA SI:   · Tiene preguntas acerca de sus síntomas.  · Necesita ser derivada a un especialista (ginecólogo, psiquiatra, o psicólogo).  SOLICITE ATENCIÓN MÉDICA DE INMEDIATO SI:   · Sufre una hemorragia vaginal abundante.  · Su período menstrual dura más de 8 días.  ·   Sus períodos son recurrentes cada menos de 21 días.  · Tiene hemorragias durante las relaciones sexuales.  · Está muy deprimido.  · Siente dolor al orinar.  · Siente dolor de cabeza intenso.  · Tiene problemas de visión.     Esta información no tiene como fin reemplazar el consejo del médico. Asegúrese de hacerle al médico cualquier pregunta que tenga.     Document Released: 10/20/2005 Document  Revised: 08/10/2013  Elsevier Interactive Patient Education ©2016 Elsevier Inc.

## 2015-11-27 NOTE — Progress Notes (Signed)
Laura Short February 22, 1971 [id number]   History:    45 y.o.  for annual gyn exam with no complaints today. She did discontinue the oral contraceptive last year because of leukorrhea and she states that her symptoms have almost completely gone. She reports normal menstrual cycles. She's currently separated from her husband and not sexually active. She had an ultrasound in [place] in December 2016 reported to be normal and was able to translate in [place].Patient with history several years ago in [place] she had a laparoscopic ovarian cystectomy for which we have no documentation. She also has a history of saline breast augmentation in the past.Patient's sister had breast cancer at the age of 82 and also a cousin as well. One cousin also had ovarian cancer. Review of her record also indicated in the past and she had been offered BRCA 1 BRCA2 testing and literature and information been provided. Due to limited financial resources she has not had this done. Patient refused the flu vaccine today.  Past medical history,surgical history, family history and social history were all reviewed and documented in the EPIC chart.  Gynecologic History Patient's last menstrual period was 11/19/2015. Contraception: none Last Pap: 2016. Results were: normal Last mammogram: 2016. Results were: Normal but dense with implants three-dimensional mammogram  Obstetric History OB History  Gravida Para Term Preterm AB SAB TAB Ectopic Multiple Living  2 2 2       2     # Outcome Date GA Lbr Len/2nd Weight Sex Delivery Anes PTL Lv  2 Term     F Vag-Spont   Y  1 Term     M Vag-Spont   Y       ROS: A ROS was performed and pertinent positives and negatives are included in the history.  GENERAL: No fevers or chills. HEENT: No change in vision, no earache, sore throat or sinus congestion. NECK: No pain or stiffness. CARDIOVASCULAR: No chest pain or pressure. No palpitations. PULMONARY: No shortness of breath, cough or  wheeze. GASTROINTESTINAL: No abdominal pain, nausea, vomiting or diarrhea, melena or bright red blood per rectum. GENITOURINARY: No urinary frequency, urgency, hesitancy or dysuria. MUSCULOSKELETAL: No joint or muscle pain, no back pain, no recent trauma. DERMATOLOGIC: No rash, no itching, no lesions. ENDOCRINE: No polyuria, polydipsia, no heat or cold intolerance. No recent change in weight. HEMATOLOGICAL: No anemia or easy bruising or bleeding. NEUROLOGIC: No headache, seizures, numbness, tingling or weakness. PSYCHIATRIC: No depression, no loss of interest in normal activity or change in sleep pattern.     Exam: chaperone present  BP 112/70 mmHg  Ht 5' 3.25" (1.607 m)  Wt 129 lb (58.514 kg)  BMI 22.66 kg/m2  LMP 11/19/2015  Body mass index is 22.66 kg/(m^2).  General appearance : Well developed well nourished female. No acute distress HEENT: Eyes: no retinal hemorrhage or exudates,  Neck supple, trachea midline, no carotid bruits, no thyroidmegaly Lungs: Clear to auscultation, no rhonchi or wheezes, or rib retractions  Heart: Regular rate and rhythm, no murmurs or gallops Breast:Examined in sitting and supine position were symmetrical in appearance, no palpable masses or tenderness,  no skin retraction, no nipple inversion, no nipple discharge, no skin discoloration, no axillary or supraclavicular lymphadenopathy Abdomen: no palpable masses or tenderness, no rebound or guarding Extremities: no edema or skin discoloration or tenderness  Pelvic:  Bartholin, Urethra, Skene Glands: Within normal limits             Vagina: No gross lesions or  discharge  Cervix: No gross lesions or discharge  Uterus  anteverted, normal size, shape and consistency, non-tender and mobile  Adnexa  Without masses or tenderness  Anus and perineum  normal   Rectovaginal  normal sphincter tone without palpated masses or tenderness             Hemoccult not indicated     Assessment/Plan:  45 y.o. female for  annual exam states that in March she will be going to [place] and we'll get a full fasting blood work and bring as a results. She was encouraged to do her monthly breast exam. Pap smear not indicated this year. Patient refused flu vaccine.   Terrance Mass MD, 2:45 PM 11/27/2015

## 2016-07-16 ENCOUNTER — Telehealth: Payer: Self-pay | Admitting: *Deleted

## 2016-07-16 DIAGNOSIS — R7989 Other specified abnormal findings of blood chemistry: Secondary | ICD-10-CM

## 2016-07-16 NOTE — Telephone Encounter (Signed)
Patient has been informed she will come in 9/14 at 9:00am to have labs drawn. Lab app will be made.

## 2016-07-16 NOTE — Telephone Encounter (Signed)
Blanca I have placed the order for repeat lab,if you would relay the information to her and have her schedule lab appointment. Thanks

## 2016-07-16 NOTE — Telephone Encounter (Signed)
-----   Message from Ok EdwardsJuan H Fernandez, MD sent at 07/15/2016  3:59 PM EDT ----- Gita KudoJennifer (Claudia or AirmontBlanca may need to take this call) I just received patient's labs that were done in [place] and it appears that her TSH is extremely low and I would like to repeat it here but with a full thyroid panel this week or next week.

## 2016-07-17 ENCOUNTER — Other Ambulatory Visit: Payer: Self-pay

## 2016-07-17 DIAGNOSIS — R7989 Other specified abnormal findings of blood chemistry: Secondary | ICD-10-CM

## 2016-07-17 LAB — THYROID PANEL WITH TSH
FREE THYROXINE INDEX: 2.2 (ref 1.4–3.8)
T3 UPTAKE: 32 % (ref 22–35)
T4 TOTAL: 6.8 ug/dL (ref 4.5–12.0)
TSH: 0.95 m[IU]/L

## 2016-11-05 ENCOUNTER — Other Ambulatory Visit: Payer: Self-pay | Admitting: Gynecology

## 2016-11-05 DIAGNOSIS — Z1231 Encounter for screening mammogram for malignant neoplasm of breast: Secondary | ICD-10-CM

## 2016-11-28 ENCOUNTER — Encounter: Payer: Self-pay | Admitting: Gynecology

## 2016-12-01 ENCOUNTER — Ambulatory Visit
Admission: RE | Admit: 2016-12-01 | Discharge: 2016-12-01 | Disposition: A | Discharge status: Home / Self Care | Payer: Self-pay | Source: Ambulatory Visit | Attending: Gynecology | Admitting: Gynecology

## 2016-12-01 DIAGNOSIS — Z1231 Encounter for screening mammogram for malignant neoplasm of breast: Secondary | ICD-10-CM

## 2016-12-11 ENCOUNTER — Ambulatory Visit (INDEPENDENT_AMBULATORY_CARE_PROVIDER_SITE_OTHER): Payer: Self-pay | Admitting: Gynecology

## 2016-12-11 ENCOUNTER — Encounter: Payer: Self-pay | Admitting: Gynecology

## 2016-12-11 VITALS — BP 122/78 | Ht 63.0 in | Wt 132.0 lb

## 2016-12-11 DIAGNOSIS — Z01419 Encounter for gynecological examination (general) (routine) without abnormal findings: Secondary | ICD-10-CM

## 2016-12-11 NOTE — Patient Instructions (Signed)
Estreimiento - Adultos  (Constipation, Adult)  Estreimiento significa que una persona tiene menos de tres evacuaciones en una semana, dificultad para defecar, o que las heces son secas, duras, o ms grandes que lo normal. A medida que envejecemos el estreimiento es ms comn. Una dieta baja en fibra, no tomar suficientes lquidos y el uso de ciertos medicamentos pueden empeorar el estreimiento.   CAUSAS    Ciertos medicamentos, como los antidepresivos, analgsicos, suplementos de hierro, anticidos y diurticos.   Algunas enfermedades, como la diabetes, el sndrome del colon irritable, enfermedad de la tiroides, o depresin.   No beber suficiente agua.   No consumir suficientes alimentos ricos en fibra.   Situaciones de estrs o viajes.   Falta de actividad fsica o de ejercicio.   Ignorar la necesidad sbita de defecar.   Uso en exceso de laxantes.  SIGNOS Y SNTOMAS    Defecar menos de tres veces por semana.   Dificultad para defecar.   Tener las heces secas y duras, o ms grandes que las normales.   Sensacin de estar lleno o hinchado.   Dolor en la parte baja del abdomen.   No sentir alivio despus de defecar.  DIAGNSTICO   El mdico le har una historia clnica y un examen fsico. Pueden hacerle exmenes adicionales para el estreimiento grave. Estos estudios pueden ser:   Un radiografa con enema de bario para examinar el recto, el colon y, en algunos casos, el intestino delgado.   Una sigmoidoscopia para examinar el colon inferior.   Una colonoscopia para examinar todo el colon.  TRATAMIENTO   El tratamiento depender de la gravedad del estreimiento y de la causa. Algunos tratamientos nutricionales son beber ms lquidos y comer ms alimentos ricos en fibra. El cambio en el estilo de vida incluye hacer ejercicios de manera regular. Si estas recomendaciones para realizar cambios en la dieta y en el estilo de vida no ayudan, el mdico le puede indicar el uso de laxantes de venta libre  para ayudarlo a defecar. Los medicamentos recetados se pueden prescribir si los medicamentos de venta libre no lo ayudan.   INSTRUCCIONES PARA EL CUIDADO EN EL HOGAR    Consuma alimentos con alto contenido de fibra, como frutas, vegetales, cereales integrales y porotos.   Limite los alimentos procesados ricos en grasas y azcar, como las papas fritas, hamburguesas, galletas, dulces y refrescos.   Puede agregar un suplemento de fibra a su dieta si no obtiene lo suficiente de los alimentos.   Beba suficiente lquido para mantener la orina clara o de color amarillo plido.   Haga ejercicio regularmente o segn las indicaciones del mdico.   Vaya al bao cuando sienta la necesidad de ir. No se aguante las ganas.   Tome solo medicamentos de venta libre o recetados, segn las indicaciones del mdico. No tome otros medicamentos para el estreimiento sin consultarlo antes con su mdico.  SOLICITE ATENCIN MDICA DE INMEDIATO SI:    Observa sangre brillante en las heces.   El estreimiento dura ms de 4 das o empeora.   Siente dolor abdominal o rectal.   Las heces son delgadas como un lpiz.   Pierde peso de manera inexplicable.  ASEGRESE DE QUE:    Comprende estas instrucciones.   Controlar su afeccin.   Recibir ayuda de inmediato si no mejora o si empeora.     Esta informacin no tiene como fin reemplazar el consejo del mdico. Asegrese de hacerle al mdico cualquier pregunta que tenga.       Document Released: 11/09/2007 Document Revised: 11/10/2014  Elsevier Interactive Patient Education 2017 Elsevier Inc.

## 2016-12-11 NOTE — Progress Notes (Signed)
Laura SignsMaria G Short [birth year] [id number]   History:    46 y.o.  for annual gyn exam with no complaints today. Patient been separated from her husband for close to 2 years and is now in a different relationship which is been steady. She is not interested in STD screen. She stated she had blood work done to include lipid profile and blood sugar in [place] less than a year ago she reports it was normal. She recently had a mammogram which was normal but dense she does have breast implants and has a family history of breast cancer and a three-dimensional mammogram had been done. Patient reports normal menstrual cycles otherwise. Patient has received the flu vaccine.  Past medical history,surgical history, family history and social history were all reviewed and documented in the EPIC chart.  Gynecologic History Patient's last menstrual period was 11/29/2016. Contraception: condoms Last Pap: 2016. Results were: normal Last mammogram: 2018. Results were: normal  Obstetric History OB History  Gravida Para Term Preterm AB Living  2 2 2     2   SAB TAB Ectopic Multiple Live Births          2    # Outcome Date GA Lbr Len/2nd Weight Sex Delivery Anes PTL Lv  2 Term     F Vag-Spont   LIV  1 Term     M Vag-Spont   LIV       ROS: A ROS was performed and pertinent positives and negatives are included in the history.  GENERAL: No fevers or chills. HEENT: No change in vision, no earache, sore throat or sinus congestion. NECK: No pain or stiffness. CARDIOVASCULAR: No chest pain or pressure. No palpitations. PULMONARY: No shortness of breath, cough or wheeze. GASTROINTESTINAL: No abdominal pain, nausea, vomiting or diarrhea, melena or bright red blood per rectum. GENITOURINARY: No urinary frequency, urgency, hesitancy or dysuria. MUSCULOSKELETAL: No joint or muscle pain, no back pain, no recent trauma. DERMATOLOGIC: No rash, no itching, no lesions. ENDOCRINE: No polyuria, polydipsia, no heat or cold  intolerance. No recent change in weight. HEMATOLOGICAL: No anemia or easy bruising or bleeding. NEUROLOGIC: No headache, seizures, numbness, tingling or weakness. PSYCHIATRIC: No depression, no loss of interest in normal activity or change in sleep pattern.     Exam: chaperone present  BP 122/78   Ht 5\' 3"  (1.6 m)   Wt 132 lb (59.9 kg)   LMP 11/29/2016   BMI 23.38 kg/m   Body mass index is 23.38 kg/m.  General appearance : Well developed well nourished female. No acute distress HEENT: Eyes: no retinal hemorrhage or exudates,  Neck supple, trachea midline, no carotid bruits, no thyroidmegaly Lungs: Clear to auscultation, no rhonchi or wheezes, or rib retractions  Heart: Regular rate and rhythm, no murmurs or gallops Breast:Examined in sitting and supine position were symmetrical in appearance, no palpable masses or tenderness,  no skin retraction, no nipple inversion, no nipple discharge, no skin discoloration, no axillary or supraclavicular lymphadenopathy Abdomen: no palpable masses or tenderness, no rebound or guarding Extremities: no edema or skin discoloration or tenderness  Pelvic:  Bartholin, Urethra, Skene Glands: Within normal limits             Vagina: No gross lesions or discharge  Cervix: No gross lesions or discharge  Uterus  anteverted, normal size, shape and consistency, non-tender and mobile  Adnexa  Without masses or tenderness  Anus and perineum  normal   Rectovaginal  normal sphincter tone without palpated masses  or tenderness             Hemoccult not indicated     Assessment/Plan:  47 y.o. female for annual exam had all her lab done less than a year ago in [place] report normal mammogram up-to-date as well as flu vaccine. Patient interested in the IUD such as [place] for which literature information was provided. Pap smear not indicated this year.   Ok Edwards MD, 2:42 PM 12/11/2016

## 2017-03-18 ENCOUNTER — Encounter: Payer: Self-pay | Admitting: Gynecology

## 2017-11-04 ENCOUNTER — Ambulatory Visit (INDEPENDENT_AMBULATORY_CARE_PROVIDER_SITE_OTHER): Payer: Self-pay | Admitting: Women's Health

## 2017-11-04 ENCOUNTER — Encounter: Payer: Self-pay | Admitting: Women's Health

## 2017-11-04 VITALS — BP 110/78

## 2017-11-04 DIAGNOSIS — N938 Other specified abnormal uterine and vaginal bleeding: Secondary | ICD-10-CM

## 2017-11-04 MED ORDER — NORGESTIMATE-ETH ESTRADIOL 0.25-35 MG-MCG PO TABS: 1.0000 | ORAL_TABLET | Freq: Every day | ORAL | 0 refills | Status: DC

## 2017-11-04 NOTE — Patient Instructions (Signed)

## 2017-11-04 NOTE — Progress Notes (Signed)
47 year old DHF G2 P2 presents with complaint of last cycle lasting 3-1/2 weeks, stopped yesterday.  History of regular monthly cycles for 5-6 days, first time cycle lasted longer than one week. LMP December 3, normal time. New partner for 7 months. Had been on OCs in the past but stopped in 2017 and used condoms most of the time.  Denies menopausal symptoms, breast tenderness. Requesting minimal, without insurance.  Exam: Appears well. No CVAT. Abdomen soft without rebound or radiation of pain. External genitalia within normal limits, speculum exam no visible discharge, erythema,  odor or blood noted. Cervix clean, healthy in appearance, GC/Chlamydia culture taken. Bimanual uterus small, no CMT or adnexal tenderness.  DUB 1 month  Plan: GC/Chlamydia culture pending, TSH, options reviewed Sprintec prescription, proper use, slight risk for blood clots and strokes reviewed. Start first day of next cycle take daily, condoms especially first month. Without insurance will have HIV, hepatitis and RPR done at health Department. Will schedule that appointment. Annual exam in  March, will evaluate cycles. Instructed to call if continued problems.

## 2017-11-06 LAB — C. TRACHOMATIS/N. GONORRHOEAE RNA
C. trachomatis RNA, TMA: NOT DETECTED
N. GONORRHOEAE RNA, TMA: NOT DETECTED

## 2018-01-14 ENCOUNTER — Other Ambulatory Visit: Payer: Self-pay | Admitting: Women's Health

## 2018-01-14 DIAGNOSIS — Z1231 Encounter for screening mammogram for malignant neoplasm of breast: Secondary | ICD-10-CM

## 2018-01-30 ENCOUNTER — Other Ambulatory Visit: Payer: Self-pay | Admitting: Women's Health

## 2018-02-01 MED ORDER — NORGESTIMATE-ETH ESTRADIOL 0.25-35 MG-MCG PO TABS
1.0000 | ORAL_TABLET | Freq: Every day | ORAL | 0 refills | Status: DC
Start: 2018-02-01 — End: 2018-05-11

## 2018-02-04 ENCOUNTER — Ambulatory Visit
Admission: RE | Admit: 2018-02-04 | Discharge: 2018-02-04 | Disposition: A | Discharge status: Home / Self Care | Payer: No Typology Code available for payment source | Source: Ambulatory Visit | Attending: Women's Health | Admitting: Women's Health

## 2018-02-04 DIAGNOSIS — Z1231 Encounter for screening mammogram for malignant neoplasm of breast: Secondary | ICD-10-CM

## 2018-02-05 ENCOUNTER — Encounter (INDEPENDENT_AMBULATORY_CARE_PROVIDER_SITE_OTHER): Payer: Self-pay

## 2018-03-23 ENCOUNTER — Encounter: Payer: Self-pay | Admitting: Women's Health

## 2018-05-11 ENCOUNTER — Ambulatory Visit (INDEPENDENT_AMBULATORY_CARE_PROVIDER_SITE_OTHER): Payer: Self-pay | Admitting: Women's Health

## 2018-05-11 ENCOUNTER — Encounter: Payer: Self-pay | Admitting: Women's Health

## 2018-05-11 VITALS — BP 110/72 | Ht 62.0 in | Wt 131.0 lb

## 2018-05-11 DIAGNOSIS — Z3041 Encounter for surveillance of contraceptive pills: Secondary | ICD-10-CM

## 2018-05-11 MED ORDER — NORGESTIMATE-ETH ESTRADIOL 0.25-35 MG-MCG PO TABS: 1.0000 | ORAL_TABLET | Freq: Every day | ORAL | 4 refills | Status: DC

## 2018-05-11 NOTE — Progress Notes (Signed)
47 year old M HF G2, P2 presents for  OC prescription.  States had annual exam in [place] with a normal Pap.  Normal mammogram 02/2018 here.  Works part-time but does not [employer] insurance,  has healthcare when visiting family in [place].  States had been on Sempra EnergySprintec and that was not available in [place] so her physician gave her a Yasmin and questions what she should be taking.  Reports normal pelvic ultrasound, labs of blood sugar and cholesterol while in [place].  Having no health problems at this time, denies vaginal discharge, urinary problems, abdominal pain or fever.  Exam: Appears well.  Blood pressure 110/72  Contraception management  Plan: Reviewed possibly going to a 20 mcg pill, will check with self-pay cost for Loestrin 1/20 or Alesse.  Prescription for Sprintec given with refills.  Reviewed slight risk for blood clots, strokes.   Continue healthy lifestyle of regular exercise.

## 2018-05-11 NOTE — Patient Instructions (Signed)
[place] (Health Maintenance, Female) Un estilo de vida saludable y los cuidados preventivos pueden favorecer considerablemente a la salud y Musician. Pregunte a su mdico cul es el cronograma de exmenes peridicos apropiado para usted. Esta es una buena oportunidad para consultarlo sobre cmo prevenir enfermedades y [place] sano. Adems de los controles, hay muchas otras cosas que puede hacer usted mismo. Los expertos han realizado numerosas investigaciones [employer] cambios en el estilo de vida y las medidas de prevencin que, [place], lo ayudarn a mantenerse sano. Solicite a su mdico ms informacin. EL PESO Y LA DIETA Consuma una dieta saludable.  Asegrese de [employer] verduras, frutas, productos lcteos de bajo contenido de [place] y Advertising account planner.  No consuma muchos alimentos de alto contenido de grasas slidas, azcares agregados o sal.  Realice actividad fsica con regularidad. Esta es una de las prcticas ms importantes que puede hacer por su salud. ? La Delorise Shiner de los adultos deben hacer ejercicio durante al menos 124mnutos por semana. El ejercicio debe aumentar la frecuencia cardaca y pActorla transpiracin (ejercicio de i[place]. ? La mayora de los adultos tambin deben hacer ejercicios de elongacin al mToysRusveces a la semana. Agregue esto al su plan de ejercicio de intensidad moderada. Mantenga un peso saludable.  El ndice de masa corporal ([facility] es una medida que puede utilizarse para identificar posibles problemas de pEast Uniontown Proporciona una estimacin de la grasa corporal basndose en el peso y la altura. Su mdico puede ayudarle a dRadiation protection practitionerISouth Endy a lScientist, forensico mTheatre managerun peso saludable.  Para las mujeres de 20aos o ms: ? Un IJohn R. Oishei Children'S Hospitalmenor de 18,5 se considera bajo peso. ? Un ICumberland County Hospitalentre 18,5 y 24,9 es normal. ? Un IPelham Medical Centerentre 25 y 29,9 se considera sobrepeso. ? Un IMC de 30 o ms se considera  obesidad. Observe los niveles de colesterol y lpidos en la sangre.  Debe comenzar a rEnglish as a second language teacherde lpidos y cResearch officer, trade unionen la sangre a los 20aos y luego repetirlos cada 516aos  Es posible que nAutomotive engineerlos niveles de colesterol con mayor frecuencia si: ? Sus niveles de lpidos y colesterol son altos. ? Es mayor de 527CWC ? Presenta un alto riesgo de padecer enfermedades cardacas. DETECCIN DE CNCER Cncer de pulmn  Se recomienda realizar exmenes de deteccin de cncer de pulmn a personas adultas entre 574y 892aos que estn en riesgo de dHorticulturist, commercialde pulmn por sus antecedentes de consumo de tabaco.  Se recomienda una tomografa computarizada de baja dosis de los pulmones todos los aos a las personas que: ? Fuman actualmente. ? Hayan dejado el hbito en algn momento en los ltimos 15aos. ? Hayan fumado durante 30aos un paquete diario. Un paquete-ao equivale a fumar un promedio de un paquete de cigarrillos diario durante un ao.  Los exmenes de deteccin anuales deben continuar hasta que hayan pasado 15aos desde que dej de fumar.  Ya no debern realizarse si tiene un problema de salud que le impida recibir tratamiento para eScience writerde pulmn. Cncer de mama  Practique la autoconciencia de la mama. Esto significa reconocer la apariencia normal de sus mamas y cmo las siente.  Tambin significa realizar autoexmenes regulares de lJohnson & Johnson Informe a su mdico sobre cualquier cambio, sin importar cun pequeo sea.  Si tiene entre 20 y 363aos, un mdico debe realizarle un examen clnico de las mamas como parte del examen regular de s[place] cada 1 a  3aos.  Si tiene 40aos o ms, debe realizarse un examen clnico de las mamas todos los aos. Tambin considere realizarse una radiografa de las mamas (mamografa) todos los aos.  Si tiene antecedentes familiares de cncer de mama, hable con su mdico para someterse a un estudio gentico.  Si  tiene alto riesgo de padecer cncer de mama, hable con su mdico para someterse a una resonancia magntica y una mamografa todos los aos.  La evaluacin del gen del cncer de mama (BRCA) se recomienda a mujeres que tengan familiares con cnceres relacionados con el BRCA. Los cnceres relacionados con el BRCA incluyen los siguientes: ? Mama. ? Ovario. ? Trompas. ? Cnceres de peritoneo.  Los resultados de la evaluacin determinarn la necesidad de asesoramiento gentico y de anlisis de BRCA1 y BRCA2. Cncer de cuello del tero El mdico puede recomendarle que se haga pruebas peridicas de deteccin de cncer de los rganos de la pelvis (ovarios, tero y vagina). Estas pruebas incluyen un examen plvico, que abarca controlar si se produjeron cambios microscpicos en la superficie del cuello del tero (prueba de Papanicolaou). Pueden recomendarle que se haga estas pruebas cada 3aos, a partir de los 21aos.  A las mujeres que tienen entre 30 y 65aos, los mdicos pueden recomendarles que se sometan a exmenes plvicos y pruebas de Papanicolaou cada 3aos, o a la prueba de Papanicolaou y el examen plvico en combinacin con estudios de deteccin del virus del papiloma humano (VPH) cada 5aos. Algunos tipos de VPH aumentan el riesgo de padecer cncer de cuello del tero. La prueba para la deteccin del VPH tambin puede realizarse a mujeres de cualquier edad cuyos resultados de la prueba de Papanicolaou no sean claros.  Es posible que otros mdicos no recomienden exmenes de deteccin a mujeres no embarazadas que se consideran sujetos de bajo riesgo de padecer cncer de pelvis y que no tienen sntomas. Pregntele al mdico si un examen plvico de deteccin es adecuado para usted.  Si ha recibido un tratamiento para el cncer cervical o una enfermedad que podra causar cncer, necesitar realizarse una prueba de Papanicolaou y controles durante al menos 20 aos de concluido el tratamiento. Si no se  ha hecho el Papanicolaou con regularidad, debern volver a evaluarse los factores de riesgo (como tener un nuevo compaero sexual), para determinar si debe realizarse los estudios nuevamente. Algunas mujeres sufren problemas mdicos que aumentan la probabilidad de contraer cncer de cuello del tero. En estos casos, el mdico podr indicar que se realicen controles y pruebas de Papanicolaou con ms frecuencia. Cncer colorrectal  Este tipo de cncer puede detectarse y a menudo prevenirse.  Por lo general, los estudios de rutina se deben comenzar a hacer a partir de los 50 aos y hasta los 75 aos.  Sin embargo, el mdico podr aconsejarle que lo haga antes, si tiene factores de riesgo para el cncer de colon.  Tambin puede recomendarle que use un kit de prueba para hallar sangre oculta en la materia fecal.  Es posible que se use una pequea cmara en el extremo de un tubo para examinar directamente el colon (sigmoidoscopia o colonoscopia) a fin de detectar formas tempranas de cncer colorrectal.  Los exmenes de rutina generalmente comienzan a los 50aos.  El examen directo del colon se debe repetir cada 5 a 10aos hasta los 75aos. Sin embargo, es posible que se realicen exmenes con mayor frecuencia, si se detectan formas tempranas de plipos precancerosos o pequeos bultos. Cncer de piel  Revise la piel   de la cabeza a los pies con regularidad.  Informe a su mdico si aparecen nuevos lunares o los que tiene se modifican, especialmente en su forma y color.  Tambin notifique al mdico si tiene un lunar que es ms grande que el tamao de una goma de lpiz.  Siempre use pantalla solar. Aplique pantalla solar de manera libre y repetida a lo largo del da.  Protjase usando mangas y pantalones largos, un sombrero de ala ancha y gafas para el sol, siempre que se encuentre en el exterior. ENFERMEDADES CARDACAS, DIABETES E HIPERTENSIN ARTERIAL  La hipertensin arterial causa  enfermedades cardacas y aumenta el riesgo de ictus. La hipertensin arterial es ms probable en los siguientes casos: ? Las personas que tienen la presin arterial en el extremo del rango normal (100-139/85-89 mm Hg). ? Las personas con sobrepeso u obesidad. ? Las personas afroamericanas.  Si usted tiene entre 18 y 39 aos, debe medirse la presin arterial cada 3 a 5 aos. Si usted tiene 40 aos o ms, debe medirse la presin arterial todos los aos. Debe medirse la presin arterial dos veces: una vez cuando est en un hospital o una clnica y la otra vez cuando est en otro sitio. Registre el promedio de las dos mediciones. Para controlar su presin arterial cuando no est en un hospital o una clnica, puede usar lo siguiente: ? Una mquina automtica para medir la presin arterial en una farmacia. ? Un monitor para medir la presin arterial en el hogar.  Si tiene entre 55 y 79 aos, consulte a su mdico si debe tomar aspirina para prevenir el ictus.  Realcese exmenes de deteccin de la diabetes con regularidad. Esto incluye la toma de una muestra de sangre para controlar el nivel de azcar en la sangre durante el ayuno. ? Si tiene un peso normal y un bajo riesgo de padecer diabetes, realcese este anlisis cada tres aos despus de los 45aos. ? Si tiene sobrepeso y un alto riesgo de padecer diabetes, considere someterse a este anlisis antes o con mayor frecuencia. PREVENCIN DE INFECCIONES HepatitisB  Si tiene un riesgo ms alto de contraer hepatitis B, debe someterse a un examen de deteccin de este virus. Se considera que tiene un alto riesgo de contraer hepatitis B si: ? Naci en un pas donde la hepatitis B es frecuente. Pregntele a su mdico qu pases son considerados de alto riesgo. ? Sus padres nacieron en un pas de alto riesgo y usted no recibi una vacuna que lo proteja contra la hepatitis B (vacuna contra la hepatitis B). ? Tiene VIH o sida. ? Usa agujas para inyectarse  drogas. ? Vive con alguien que tiene hepatitis B. ? Ha tenido sexo con alguien que tiene hepatitis B. ? Recibe tratamiento de hemodilisis. ? Toma ciertos medicamentos para el cncer, trasplante de rganos y afecciones autoinmunitarias. Hepatitis C  Se recomienda un anlisis de sangre para: ? Todos los que nacieron entre 1945 y 1965. ? Todas las personas que tengan un riesgo de haber contrado hepatitis C. Enfermedades de transmisin sexual (ETS).  Debe realizarse pruebas de deteccin de enfermedades de transmisin sexual (ETS), incluidas gonorrea y clamidia si: ? Es sexualmente activo y es menor de 24aos. ? Es mayor de 24aos, y el mdico le informa que corre riesgo de tener este tipo de infecciones. ? La actividad sexual ha cambiado desde que le hicieron la ltima prueba de deteccin y tiene un riesgo mayor de tener clamidia o gonorrea. Pregntele al mdico si usted   tiene riesgo.  Si no tiene el VIH, pero corre riesgo de infectarse por el virus, se recomienda tomar diariamente un medicamento recetado para evitar la infeccin. Esto se conoce como profilaxis previa a la exposicin. Se considera que est en riesgo si: ? Es [place] sexualmente y no [place] preservativos habitualmente o no conoce el estado del VIH de sus Advertising copywriter. ? Se inyecta drogas. ? Es [place] sexualmente con Ardelia Mems pareja que tiene VIH. Consulte a su mdico para saber si tiene un alto riesgo de infectarse por el VIH. Si opta por comenzar la profilaxis previa a la exposicin, primero debe realizarse anlisis de deteccin del VIH. Luego, le harn anlisis cada 34mses mientras est tomando los medicamentos para la profilaxis previa a la exposicin. [facility] Si es premenopusica y puede quedar e[place] solicite a su mdico asesoramiento previo a la concepcin.  Si puede quedar embarazada, tome 400 a [id number]([id number]) de cido fAnheuser-Busch  Si desea evitar el embarazo, hable con su mdico sobre el  control de la natalidad (anticoncepcin). OSTEOPOROSIS Y MENOPAUSIA  La osteoporosis es una enfermedad en la que los huesos pierden los minerales y la fuerza por el avance de la edad. El resultado pueden ser fracturas graves en los hSaybrook El riesgo de osteoporosis puede identificarse con uArdelia Memsprueba de densidad sea.  Si tiene 65aos o ms, o si est en riesgo de sufrir osteoporosis y fracturas, pregunte a su mdico si debe someterse a exmenes.  Consulte a su mdico si debe tomar un suplemento de calcio o de vitamina D para reducir el riesgo de osteoporosis.  La menopausia puede presentar ciertos sntomas fsicos y rGaffer  La terapia de reemplazo hormonal puede reducir algunos de estos sntomas y rGaffer Consulte a su mdico para saber si la terapia de reemplazo hormonal es conveniente para usted. INSTRUCCIONES PARA EL CUIDADO EN EL HOGAR  Realcese los estudios de rutina de la salud, dentales y de lPublic librarian  MBath  No consuma ningn producto que contenga tabaco, lo que incluye cigarrillos, tabaco de mHigher education careers advisero cPsychologist, sport and exercise  Si est embarazada, no beba alcohol.  Si est amamantando, reduzca el consumo de alcohol y la frecuencia con la que consume.  Si es mujer y no est embarazada limite el consumo de alcohol a no ms de 1 medida por da. Una medida equivale a 12onzas de cerveza, 5onzas de vino o 1onzas de bebidas alcohlicas de alta graduacin.  No consuma drogas.  No comparta agujas.  Solicite ayuda a su mdico si necesita apoyo o informacin para abandonar las drogas.  Informe a su mdico si a menudo se siente deprimido.  Notifique a su mdico si alguna vez ha sido vctima de abuso o si no se siente seguro en su hogar. Esta informacin no tiene cMarine scientistel consejo del mdico. Asegrese de hacerle al mdico cualquier pregunta que tenga. Document Released: 10/09/2011 Document Revised: 11/10/2014 Document Reviewed:  07/24/2015 Elsevier Interactive Patient Education  2[employer]

## 2019-07-05 ENCOUNTER — Other Ambulatory Visit: Payer: Self-pay

## 2019-07-06 ENCOUNTER — Ambulatory Visit (INDEPENDENT_AMBULATORY_CARE_PROVIDER_SITE_OTHER): Payer: Self-pay | Admitting: Women's Health

## 2019-07-06 ENCOUNTER — Encounter: Payer: Self-pay | Admitting: Women's Health

## 2019-07-06 VITALS — BP 110/78 | Ht 63.0 in | Wt 133.0 lb

## 2019-07-06 DIAGNOSIS — Z803 Family history of malignant neoplasm of breast: Secondary | ICD-10-CM

## 2019-07-06 DIAGNOSIS — Z01419 Encounter for gynecological examination (general) (routine) without abnormal findings: Secondary | ICD-10-CM

## 2019-07-06 LAB — CBC WITH DIFFERENTIAL/PLATELET
Absolute Monocytes: 471 cells/uL (ref 200–950)
Basophils Absolute: 53 cells/uL (ref 0–200)
Basophils Relative: 0.7 %
Eosinophils Absolute: 84 cells/uL (ref 15–500)
Eosinophils Relative: 1.1 %
HCT: 37.5 % (ref 35.0–45.0)
Hemoglobin: 12.2 g/dL (ref 11.7–15.5)
Lymphs Abs: 2234 cells/uL (ref 850–3900)
MCH: 29.6 pg (ref 27.0–33.0)
MCHC: 32.5 g/dL (ref 32.0–36.0)
MCV: 91 fL (ref 80.0–100.0)
MPV: 10.3 fL (ref 7.5–12.5)
Monocytes Relative: 6.2 %
Neutro Abs: 4758 cells/uL (ref 1500–7800)
Neutrophils Relative %: 62.6 %
Platelets: 316 10*3/uL (ref 140–400)
RBC: 4.12 10*6/uL (ref 3.80–5.10)
RDW: 11.9 % (ref 11.0–15.0)
Total Lymphocyte: 29.4 %
WBC: 7.6 10*3/uL (ref 3.8–10.8)

## 2019-07-06 MED ORDER — NORGESTIMATE-ETH ESTRADIOL 0.25-35 MG-MCG PO TABS: 1.0000 | ORAL_TABLET | Freq: Every day | ORAL | 4 refills | Status: DC

## 2019-07-06 NOTE — Progress Notes (Signed)
Laura Short [birth year] [id number]    History:    Presents for annual exam.  Light monthly cycle on Sprintec.  States does have some cramping intermittently on either right or left side with prior to cycle.  Denies any spotting, discharge, back pain, urinary symptoms or fever.  Normal Pap history last Pap 2018 in [place] reports as normal, no record.  Normal mammogram history.  Same partner, negative STD screen.  Past medical history, past surgical history, family history and social history were all reviewed and documented in the EPIC chart.  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  Originally from [place], has 6 siblings all live in the [place], parents live in [place].  No family history of diabetes.  1 sister breast cancer survivor.  ROS:  A ROS was performed and pertinent positives and negatives are included.  Exam:  Vitals:   07/06/19 0804  BP: 110/78  Weight: 133 lb (60.3 kg)  Height: 5\' 3"  (1.6 m)   Body mass index is 23.56 kg/m.   General appearance:  Normal Thyroid:  Symmetrical, normal in size, without palpable masses or nodularity. Respiratory  Auscultation:  Clear without wheezing or rhonchi Cardiovascular  Auscultation:  Regular rate, without rubs, murmurs or gallops  Edema/varicosities:  Not grossly evident Abdominal  Soft,nontender, without masses, guarding or rebound.  Liver/spleen:  No organomegaly noted  Hernia:  None appreciated  Skin  Inspection:  Grossly normal   Breasts: Examined lying and sitting. Bilateral implants    Right: Without masses, retractions, discharge or axillary adenopathy.     Left: Without masses, retractions, discharge or axillary adenopathy. Gentitourinary   Inguinal/mons:  Normal without inguinal adenopathy  External genitalia:  Normal  BUS/Urethra/Skene's glands:  Normal  Vagina:  Normal  Cervix:  Normal  Uterus:  normal in size, shape and contour.  Midline and mobile  Adnexa/parametria:     Rt: Without masses or  tenderness.   Lt: Without masses or tenderness.  Anus and perineum: Normal  Digital rectal exam: Normal sphincter tone without palpated masses or tenderness  Assessment/Plan:  48 y.o. SHF G2, P2 for annual exam.     Light monthly cycle on Sprintec  Plan: .  Sprintec prescription, proper use, slight risk for blood clots and strokes reviewed.  Brought in a record of blood pressures ranged from 90-110/50-70.  SBE's, annual screening mammogram, nausea information given for breast center instructed to schedule.  Continue healthy lifestyle with regular exercise, healthy diet, calcium rich foods, vitamin D 1000 daily encouraged.  2016 excellent lipid panel, instructed to come fasting next year we will check FLP.  CBC, Pap with HR HPV typing, the screening guidelines reviewed.   [facility], 8:13 AM 07/06/2019

## 2019-07-06 NOTE — Patient Instructions (Addendum)
Vit D3 2000 iu daily Get mammogram! Good to see you!  Health Maintenance, Female Adopting a healthy lifestyle and getting preventive care are important in promoting health and wellness. Ask your health care provider about:  The right schedule for you to have regular tests and exams.  Things you can do on your own to prevent diseases and keep yourself healthy. What should I know about diet, weight, and exercise? Eat a healthy diet   Eat a diet that includes plenty of vegetables, fruits, low-fat dairy products, and lean protein.  Do not eat a lot of foods that are high in solid fats, added sugars, or sodium. Maintain a healthy weight Body mass index (BMI) is used to identify weight problems. It estimates body fat based on height and weight. Your health care provider can help determine your BMI and help you achieve or maintain a healthy weight. Get regular exercise Get regular exercise. This is one of the most important things you can do for your health. Most adults should:  Exercise for at least 150 minutes each week. The exercise should increase your heart rate and make you sweat (moderate-intensity exercise).  Do strengthening exercises at least twice a week. This is in addition to the moderate-intensity exercise.  Spend less time sitting. Even light physical activity can be beneficial. Watch cholesterol and blood lipids Have your blood tested for lipids and cholesterol at 48 years of age, then have this test every 5 years. Have your cholesterol levels checked more often if:  Your lipid or cholesterol levels are high.  You are older than 48 years of age.  You are at high risk for heart disease. What should I know about cancer screening? Depending on your health history and family history, you may need to have cancer screening at various ages. This may include screening for:  Breast cancer.  Cervical cancer.  Colorectal cancer.  Skin cancer.  Lung cancer. What should I  know about heart disease, diabetes, and high blood pressure? Blood pressure and heart disease  High blood pressure causes heart disease and increases the risk of stroke. This is more likely to develop in people who have high blood pressure readings, are of African descent, or are overweight.  Have your blood pressure checked: ? Every 3-5 years if you are 81-45 years of age. ? Every year if you are 33 years old or older. Diabetes Have regular diabetes screenings. This checks your fasting blood sugar level. Have the screening done:  Once every three years after age 68 if you are at a normal weight and have a low risk for diabetes.  More often and at a younger age if you are overweight or have a high risk for diabetes. What should I know about preventing infection? Hepatitis B If you have a higher risk for hepatitis B, you should be screened for this virus. Talk with your health care provider to find out if you are at risk for hepatitis B infection. Hepatitis C Testing is recommended for:  Everyone born from [birth year] through 1965.  Anyone with known risk factors for hepatitis C. Sexually transmitted infections (STIs)  Get screened for STIs, including gonorrhea and chlamydia, if: ? You are sexually active and are younger than 48 years of age. ? You are older than 48 years of age and your health care provider tells you that you are at risk for this type of infection. ? Your sexual activity has changed since you were last screened, and you are at  increased risk for chlamydia or gonorrhea. Ask your health care provider if you are at risk.  Ask your health care provider about whether you are at high risk for HIV. Your health care provider may recommend a prescription medicine to help prevent HIV infection. If you choose to take medicine to prevent HIV, you should first get tested for HIV. You should then be tested every 3 months for as long as you are taking the medicine. Pregnancy  If you are  about to stop having your period (premenopausal) and you may become pregnant, seek counseling before you get pregnant.  Take 400 to 800 micrograms (mcg) of folic acid every day if you become pregnant.  Ask for birth control (contraception) if you want to prevent pregnancy. Osteoporosis and menopause Osteoporosis is a disease in which the bones lose minerals and strength with aging. This can result in bone fractures. If you are 46 years old or older, or if you are at risk for osteoporosis and fractures, ask your health care provider if you should:  Be screened for bone loss.  Take a calcium or vitamin D supplement to lower your risk of fractures.  Be given hormone replacement therapy (HRT) to treat symptoms of menopause. Follow these instructions at home: Lifestyle  Do not use any products that contain nicotine or tobacco, such as cigarettes, e-cigarettes, and chewing tobacco. If you need help quitting, ask your health care provider.  Do not use street drugs.  Do not share needles.  Ask your health care provider for help if you need support or information about quitting drugs. Alcohol use  Do not drink alcohol if: ? Your health care provider tells you not to drink. ? You are pregnant, may be pregnant, or are planning to become pregnant.  If you drink alcohol: ? Limit how much you use to 0-1 drink a day. ? Limit intake if you are breastfeeding.  Be aware of how much alcohol is in your drink. In the [place] one drink equals one 12 oz bottle of beer (355 mL), one 5 oz glass of wine (148 mL), or one 1 oz glass of hard liquor (44 mL). General instructions  Schedule regular health, dental, and eye exams.  Stay current with your vaccines.  Tell your health care provider if: ? You often feel depressed. ? You have ever been abused or do not feel safe at home. Summary  Adopting a healthy lifestyle and getting preventive care are important in promoting health and wellness.  Follow  your health care provider's instructions about healthy diet, exercising, and getting tested or screened for diseases.  Follow your health care provider's instructions on monitoring your cholesterol and blood pressure. This information is not intended to replace advice given to you by your health care provider. Make sure you discuss any questions you have with your health care provider. Document Released: 05/05/2011 Document Revised: 10/13/2018 Document Reviewed: 10/13/2018 Elsevier Patient Education  2020 [employer].

## 2019-07-07 LAB — PAP, TP IMAGING W/ HPV RNA, RFLX HPV TYPE 16,18/45: HPV DNA High Risk: NOT DETECTED

## 2019-07-22 ENCOUNTER — Other Ambulatory Visit: Payer: Self-pay | Admitting: Women's Health

## 2019-07-22 DIAGNOSIS — Z1231 Encounter for screening mammogram for malignant neoplasm of breast: Secondary | ICD-10-CM

## 2019-10-24 ENCOUNTER — Ambulatory Visit: Payer: No Typology Code available for payment source

## 2019-12-05 ENCOUNTER — Ambulatory Visit
Admission: RE | Admit: 2019-12-05 | Discharge: 2019-12-05 | Disposition: A | Discharge status: Home / Self Care | Payer: No Typology Code available for payment source | Source: Ambulatory Visit | Attending: Women's Health | Admitting: Women's Health

## 2019-12-05 ENCOUNTER — Other Ambulatory Visit: Payer: Self-pay

## 2019-12-05 DIAGNOSIS — Z1231 Encounter for screening mammogram for malignant neoplasm of breast: Secondary | ICD-10-CM

## 2020-06-28 ENCOUNTER — Other Ambulatory Visit: Payer: Self-pay | Admitting: *Deleted

## 2020-06-28 MED ORDER — NORGESTIMATE-ETH ESTRADIOL 0.25-35 MG-MCG PO TABS: 1.0000 | ORAL_TABLET | Freq: Every day | ORAL | 0 refills | Status: DC

## 2020-09-06 ENCOUNTER — Other Ambulatory Visit: Payer: Self-pay

## 2021-02-28 ENCOUNTER — Other Ambulatory Visit: Payer: Self-pay | Admitting: *Deleted

## 2021-02-28 DIAGNOSIS — Z1231 Encounter for screening mammogram for malignant neoplasm of breast: Secondary | ICD-10-CM

## 2021-03-01 ENCOUNTER — Other Ambulatory Visit: Payer: Self-pay | Admitting: Nurse Practitioner

## 2021-03-01 DIAGNOSIS — Z1231 Encounter for screening mammogram for malignant neoplasm of breast: Secondary | ICD-10-CM

## 2021-03-15 ENCOUNTER — Ambulatory Visit
Admission: RE | Admit: 2021-03-15 | Discharge: 2021-03-15 | Disposition: A | Discharge status: Home / Self Care | Payer: No Typology Code available for payment source | Source: Ambulatory Visit | Attending: Nurse Practitioner | Admitting: Nurse Practitioner

## 2021-03-15 DIAGNOSIS — Z1231 Encounter for screening mammogram for malignant neoplasm of breast: Secondary | ICD-10-CM

## 2021-03-20 ENCOUNTER — Encounter: Payer: Self-pay | Admitting: Nurse Practitioner

## 2021-03-20 ENCOUNTER — Ambulatory Visit (INDEPENDENT_AMBULATORY_CARE_PROVIDER_SITE_OTHER): Payer: Self-pay | Admitting: Nurse Practitioner

## 2021-03-20 VITALS — BP 118/76 | Ht 63.0 in | Wt 135.0 lb

## 2021-03-20 DIAGNOSIS — Z01419 Encounter for gynecological examination (general) (routine) without abnormal findings: Secondary | ICD-10-CM

## 2021-03-20 DIAGNOSIS — Z3041 Encounter for surveillance of contraceptive pills: Secondary | ICD-10-CM

## 2021-03-20 NOTE — Patient Instructions (Signed)
Health Maintenance, Female Adopting a healthy lifestyle and getting preventive care are important in promoting health and wellness. Ask your health care provider about:  The right schedule for you to have regular tests and exams.  Things you can do on your own to prevent diseases and keep yourself healthy. What should I know about diet, weight, and exercise? Eat a healthy diet  Eat a diet that includes plenty of vegetables, fruits, low-fat dairy products, and lean protein.  Do not eat a lot of foods that are high in solid fats, added sugars, or sodium.   Maintain a healthy weight Body mass index (BMI) is used to identify weight problems. It estimates body fat based on height and weight. Your health care provider can help determine your BMI and help you achieve or maintain a healthy weight. Get regular exercise Get regular exercise. This is one of the most important things you can do for your health. Most adults should:  Exercise for at least 150 minutes each week. The exercise should increase your heart rate and make you sweat (moderate-intensity exercise).  Do strengthening exercises at least twice a week. This is in addition to the moderate-intensity exercise.  Spend less time sitting. Even light physical activity can be beneficial. Watch cholesterol and blood lipids Have your blood tested for lipids and cholesterol at 50 years of age, then have this test every 5 years. Have your cholesterol levels checked more often if:  Your lipid or cholesterol levels are high.  You are older than 50 years of age.  You are at high risk for heart disease. What should I know about cancer screening? Depending on your health history and family history, you may need to have cancer screening at various ages. This may include screening for:  Breast cancer.  Cervical cancer.  Colorectal cancer.  Skin cancer.  Lung cancer. What should I know about heart disease, diabetes, and high blood  pressure? Blood pressure and heart disease  High blood pressure causes heart disease and increases the risk of stroke. This is more likely to develop in people who have high blood pressure readings, are of African descent, or are overweight.  Have your blood pressure checked: ? Every 3-5 years if you are 18-39 years of age. ? Every year if you are 40 years old or older. Diabetes Have regular diabetes screenings. This checks your fasting blood sugar level. Have the screening done:  Once every three years after age 40 if you are at a normal weight and have a low risk for diabetes.  More often and at a younger age if you are overweight or have a high risk for diabetes. What should I know about preventing infection? Hepatitis B If you have a higher risk for hepatitis B, you should be screened for this virus. Talk with your health care provider to find out if you are at risk for hepatitis B infection. Hepatitis C Testing is recommended for:  Everyone born from 1945 through 1965.  Anyone with known risk factors for hepatitis C. Sexually transmitted infections (STIs)  Get screened for STIs, including gonorrhea and chlamydia, if: ? You are sexually active and are younger than 50 years of age. ? You are older than 50 years of age and your health care provider tells you that you are at risk for this type of infection. ? Your sexual activity has changed since you were last screened, and you are at increased risk for chlamydia or gonorrhea. Ask your health care provider   if you are at risk.  Ask your health care provider about whether you are at high risk for HIV. Your health care provider may recommend a prescription medicine to help prevent HIV infection. If you choose to take medicine to prevent HIV, you should first get tested for HIV. You should then be tested every 3 months for as long as you are taking the medicine. Pregnancy  If you are about to stop having your period (premenopausal) and  you may become pregnant, seek counseling before you get pregnant.  Take 400 to 800 micrograms (mcg) of folic acid every day if you become pregnant.  Ask for birth control (contraception) if you want to prevent pregnancy. Osteoporosis and menopause Osteoporosis is a disease in which the bones lose minerals and strength with aging. This can result in bone fractures. If you are 65 years old or older, or if you are at risk for osteoporosis and fractures, ask your health care provider if you should:  Be screened for bone loss.  Take a calcium or vitamin D supplement to lower your risk of fractures.  Be given hormone replacement therapy (HRT) to treat symptoms of menopause. Follow these instructions at home: Lifestyle  Do not use any products that contain nicotine or tobacco, such as cigarettes, e-cigarettes, and chewing tobacco. If you need help quitting, ask your health care provider.  Do not use street drugs.  Do not share needles.  Ask your health care provider for help if you need support or information about quitting drugs. Alcohol use  Do not drink alcohol if: ? Your health care provider tells you not to drink. ? You are pregnant, may be pregnant, or are planning to become pregnant.  If you drink alcohol: ? Limit how much you use to 0-1 drink a day. ? Limit intake if you are breastfeeding.  Be aware of how much alcohol is in your drink. In the [place] one drink equals one 12 oz bottle of beer (355 mL), one 5 oz glass of wine (148 mL), or one 1 oz glass of hard liquor (44 mL). General instructions  Schedule regular health, dental, and eye exams.  Stay current with your vaccines.  Tell your health care provider if: ? You often feel depressed. ? You have ever been abused or do not feel safe at home. Summary  Adopting a healthy lifestyle and getting preventive care are important in promoting health and wellness.  Follow your health care provider's instructions about healthy  diet, exercising, and getting tested or screened for diseases.  Follow your health care provider's instructions on monitoring your cholesterol and blood pressure. This information is not intended to replace advice given to you by your health care provider. Make sure you discuss any questions you have with your health care provider. Document Revised: 10/13/2018 Document Reviewed: 10/13/2018 Elsevier Patient Education  2021 [employer] Inc[employer]  

## 2021-03-20 NOTE — Progress Notes (Signed)
   Laura Short 12-04-1970 [id number]   History:  50 y.o. G2P2002 presents for annual exam. OCPs. Normal pap and mammogram history. Usually gets lab work and some preventative screenings in [place].  Gynecologic History Patient's last menstrual period was 03/12/2021. Period Cycle (Days): 28 Period Duration (Days): 5 Period Pattern: Regular Menstrual Flow: Moderate Dysmenorrhea: None Contraception/Family planning: OCP (estrogen/progesterone)  Health Maintenance Last Pap: 07/06/2019. Results were: ASCUS negative HPV Last mammogram: 03/15/2021. Results were: normal Last colonoscopy: Never  Last Dexa: Not indicated  Past medical history, past surgical history, family history and social history were all reviewed and documented in the EPIC chart. Married. Daughter lives in [place], son close by. 1 grandson. Owns [employer]. Sister and cousin with history of breast cancer. Cousin also had ovarian cancer.   ROS:  A ROS was performed and pertinent positives and negatives are included.  Exam:  Vitals:   03/20/21 1057  BP: 118/76  Weight: 135 lb (61.2 kg)  Height: 5\' 3"  (1.6 m)   Body mass index is 23.91 kg/m.  General appearance:  Normal Thyroid:  Symmetrical, normal in size, without palpable masses or nodularity. Respiratory  Auscultation:  Clear without wheezing or rhonchi Cardiovascular  Auscultation:  Regular rate, without rubs, murmurs or gallops  Edema/varicosities:  Not grossly evident Abdominal  Soft,nontender, without masses, guarding or rebound.  Liver/spleen:  No organomegaly noted  Hernia:  None appreciated  Skin  Inspection:  Grossly normal Breasts: Examined lying and sitting. Bilateral breast implants noted  Right: Without masses, retractions, nipple discharge or axillary adenopathy.   Left: Without masses, retractions, nipple discharge or axillary adenopathy. Genitourinary   Inguinal/mons:  Normal without inguinal adenopathy  External genitalia:  Normal  appearing vulva with no masses, tenderness, or lesions  BUS/Urethra/Skene's glands:  Normal  Vagina:  Normal appearing with normal color and discharge, no lesions  Cervix:  Normal appearing without discharge or lesions  Uterus:  Normal in size, shape and contour.  Midline and mobile, nontender  Adnexa/parametria:     Rt: Normal in size, without masses or tenderness.   Lt: Normal in size, without masses or tenderness.  Anus and perineum: Normal  Digital rectal exam: Normal sphincter tone without palpated masses or tenderness  Assessment/Plan:  50 y.o. 50 for annual exam.   Well female exam with routine gynecological exam - Education provided on SBEs, importance of preventative screenings, current guidelines, high calcium diet, regular exercise, and multivitamin daily. Labs done in [id number].   Encounter for surveillance of contraceptive pills - Taking as prescribed. She is currently taking OCPs prescribed in [place] and has a few more months of refills. She will let [place] know when she needs more.   Screening for cervical cancer - 2020 pap ASCUS negative HPV. Will repeat at 3-year interval per guidelines.  Screening for breast cancer - Normal mammogram history.  Continue annual screenings.  Normal breast exam today. Sister and cousin with breast cancer history.   Screening for colon cancer - Has not have screening colonoscopy and plans to do this in 2021 this year.   Return in 1 year for annual.    [place] DNP, 11:16 AM 03/20/2021

## 2021-06-12 ENCOUNTER — Encounter: Payer: Self-pay | Admitting: Cardiology

## 2021-06-12 ENCOUNTER — Ambulatory Visit: Payer: Self-pay | Admitting: Cardiology

## 2021-06-12 ENCOUNTER — Other Ambulatory Visit: Payer: Self-pay

## 2021-06-12 VITALS — BP 102/68 | HR 76 | Temp 98.3°F | Resp 17 | Ht 63.0 in | Wt 137.2 lb

## 2021-06-12 DIAGNOSIS — H538 Other visual disturbances: Secondary | ICD-10-CM

## 2021-06-12 NOTE — Progress Notes (Signed)
Primary Physician/Referring:  Patient, No Pcp Per (Inactive)  Patient ID: Laura Short, female    DOB: 1971-08-13, 50 y.o.   MRN: [id number]  Chief Complaint  Patient presents with   New Patient (Initial Visit)   low bp   Blurred Vision   HPI:    Laura Short  is a 50 y.o. Spanish female patient who was brought by her son for evaluation of blurred vision.  She has no history of dizziness or syncope, continues to exercise at least 4 days a week either on the elliptical or by walking and also light weight exercise.  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  No dizziness or syncope, otherwise asymptomatic.  No chest pain, dyspnea.  No leg edema.  Past Medical History:  Diagnosis Date   Hypotension    Past Surgical History:  Procedure Laterality Date   AUGMENTATION MAMMAPLASTY Bilateral    BREAST SURGERY  2002   BREAST AUGMENTATION - SALINE   PELVIC LAPAROSCOPY     OVARIAN CYSTECTOMY   RHINOPLASTY  1998   Family History  Problem Relation Age of Onset   Parkinson's disease Father    Breast cancer Sister 19   Breast cancer Sister 6   Ovarian cancer Cousin    Breast cancer Cousin     Social History   Tobacco Use   Smoking status: Never   Smokeless tobacco: Never  Substance Use Topics   Alcohol use: Yes    Alcohol/week: 1.0 standard drink    Types: 1 Glasses of wine per week    Comment: Rare   Marital Status: Married  ROS  Review of Systems  Eyes:  Positive for blurred vision (near vision).  Cardiovascular:  Negative for chest pain, dyspnea on exertion and leg swelling.  Gastrointestinal:  Negative for melena.  Objective  Blood pressure 102/68, pulse 76, temperature 98.3 F (36.8 C), temperature source Temporal, resp. rate 17, height 5\' 3"  (1.6 m), weight 137 lb 3.2 oz (62.2 kg), SpO2 98 %. Body mass index is 24.3 kg/m.  Vitals with BMI 06/12/2021 03/20/2021 07/06/2019  Height 5\' 3"  5\' 3"  5\' 3"   Weight 137 lbs 3 oz 135 lbs 133 lbs  BMI 24.31 23.92 23.57   Systolic 102 118 09/05/2019  Diastolic 68 76 78  Pulse 76 - -    Physical Exam Neck:     Vascular: No carotid bruit or JVD.  Cardiovascular:     Rate and Rhythm: Normal rate and regular rhythm.     Pulses: Intact distal pulses.     Heart sounds: Normal heart sounds. No murmur heard.   No gallop.  Pulmonary:     Effort: Pulmonary effort is normal.     Breath sounds: Normal breath sounds.  Abdominal:     General: Bowel sounds are normal.     Palpations: Abdomen is soft.  Musculoskeletal:        General: No swelling.     Laboratory examination:   No results for input(s): NA, K, CL, CO2, GLUCOSE, BUN, CREATININE, CALCIUM, GFRNONAA, GFRAA in the last 8760 hours. CrCl cannot be calculated (Patient's most recent lab result is older than the maximum 21 days allowed.).  CMP Latest Ref Rng & Units 11/21/2014  Glucose 70 - 99 mg/dL 84  BUN 6 - 23 mg/dL 14  Creatinine - mg/dL 950  Sodium 11/23/2014 - 9.32 mEq/L 139  Potassium 3.5 - 5.3 mEq/L 5.1  Chloride 96 - 112 mEq/L 106  CO2  19 - 32 mEq/L 23  Calcium 8.4 - 10.5 mg/dL 9.6  Total Protein 6.0 - 8.3 g/dL 7.1  Total Bilirubin 0.2 - 1.2 mg/dL 0.7  Alkaline Phos 39 - 117 U/L 50  AST 0 - 37 U/L 21  ALT 0 - 35 U/L 19   CBC Latest Ref Rng & Units 07/06/2019 11/21/2014 09/28/2012  WBC 3.8 - 10.8 Thousand/uL 7.6 4.5 6.2  Hemoglobin 11.7 - 15.5 g/dL 93.7 34.2 87.6  Hematocrit 35.0 - 45.0 % 37.5 39.2 39.9  Platelets 140 - 400 Thousand/uL 316 301 329    Lipid Panel No results for input(s): CHOL, TRIG, LDLCALC, VLDL, HDL, CHOLHDL, LDLDIRECT in the last 8760 hours. Lipid Panel     Component Value Date/Time   CHOL 170 11/21/2014 1004   TRIG 73 11/21/2014 1004   HDL 86 11/21/2014 1004   CHOLHDL 2.0 11/21/2014 1004   VLDL 15 11/21/2014 1004   LDLCALC 69 11/21/2014 1004     HEMOGLOBIN A1C No results found for: HGBA1C, MPG TSH No results for input(s): TSH in the last 8760 hours.  Medications and allergies  No Known Allergies    Medication prior to this encounter:   Outpatient Medications Prior to Visit  Medication Sig Dispense Refill   acetaminophen (TYLENOL) 500 MG tablet Take 500 mg by mouth every 6 (six) hours as needed.     MULTIPLE VITAMINS PO Take 1 tablet by mouth daily.     norgestimate-ethinyl estradiol (ORTHO-CYCLEN) 0.25-35 MG-MCG tablet Take 1 tablet by mouth daily. 84 tablet 0   No facility-administered medications prior to visit.     Medication list after today's encounter   Current Outpatient Medications  Medication Instructions   acetaminophen (TYLENOL) 500 mg, Oral, Every 6 hours PRN   MULTIPLE VITAMINS PO 1 tablet, Oral, Daily   norgestimate-ethinyl estradiol (ORTHO-CYCLEN) 0.25-35 MG-MCG tablet 1 tablet, Oral, Daily    Radiology:   No results found.  Cardiac Studies:   NA  EKG:    EKG 06/12/2021: Normal sinus rhythm at rate of 72 bpm, normal axis. Low voltage -possible pulmonary disease.    Assessment     ICD-10-CM   1. Blurred vision  H53.8 EKG 12-Lead       There are no discontinued medications.  No orders of the defined types were placed in this encounter.  Orders Placed This Encounter  Procedures   EKG 12-Lead   Recommendations:   Laura Short is a 50 y.o. Spanish female patient who was brought by her son for evaluation of blurred vision.  She has no history of dizziness or syncope, continues to exercise at least 4 days a week either on the elliptical or by walking and also light weight exercise.  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  Physical examination is unremarkable, blood pressure is normal, I reviewed her labs, renal function and CBC are normal.  4 years ago her cholesterol was excellent.  Her symptoms of blurred vision is due to presbyopia, she has seen ophthalmologist about 6 months ago, she was prescribed correction lenses for reading however she does not like to wear them.  She has blurred vision only for near objects only.  I have  explained to her that her blood pressure is completely normal and without symptoms of dizziness, syncope, no further evaluation is indicated.  EKG does reveal low-voltage complexes, probably related to bilateral breast implant.  She is ideal body weight, she has no dyspnea, physical examination is normal with normal heart sounds.  No further evaluation is indicated.  I will see her back on a as needed basis.  I will forward a copy of this to her GYN.    Yates Decamp, MD, [facility] 06/12/2021, 11:31 AM Office: [phone]

## 2021-10-30 ENCOUNTER — Other Ambulatory Visit: Payer: Self-pay | Admitting: Nurse Practitioner

## 2021-10-30 MED ORDER — NORGESTIMATE-ETH ESTRADIOL 0.25-35 MG-MCG PO TABS: 1.0000 | ORAL_TABLET | Freq: Every day | ORAL | 1 refills | Status: AC

## 2021-10-30 NOTE — Telephone Encounter (Signed)
AEX 03/20/2021.

## 2022-02-26 ENCOUNTER — Other Ambulatory Visit: Payer: Self-pay | Admitting: Nurse Practitioner

## 2022-02-26 DIAGNOSIS — Z1231 Encounter for screening mammogram for malignant neoplasm of breast: Secondary | ICD-10-CM

## 2022-03-17 ENCOUNTER — Ambulatory Visit: Payer: No Typology Code available for payment source

## 2022-03-21 ENCOUNTER — Ambulatory Visit
Admission: RE | Admit: 2022-03-21 | Discharge: 2022-03-21 | Disposition: A | Discharge status: Home / Self Care | Payer: No Typology Code available for payment source | Source: Ambulatory Visit | Attending: Nurse Practitioner | Admitting: Nurse Practitioner

## 2022-03-21 DIAGNOSIS — Z1231 Encounter for screening mammogram for malignant neoplasm of breast: Secondary | ICD-10-CM

## 2022-04-30 ENCOUNTER — Telehealth: Payer: Self-pay | Admitting: *Deleted

## 2022-04-30 NOTE — Telephone Encounter (Signed)
[place] I called patient daughter and told her she needs to get her mother to allow DPR access next time. I relayed to daughter Laura Short response.

## 2022-04-30 NOTE — Telephone Encounter (Signed)
Patient daughter Laura Short (not listed on her DPR)  called and left detailed message in triage voicemail stating her mother is scheduled for annual GYN exam on 05/14/22. Patient needs medical clearance per plastic surgeon to have breast implants removed. The daughter wanted to know if this is something that can be done on 05/14/22? Patient does not have PCP.  Please advise

## 2022-04-30 NOTE — Telephone Encounter (Signed)
Laura Short can you call the patient and relay [name] response. Patient daughter called however she doesn't have DPR access. I called patient and her voicemail was in spanish.

## 2022-04-30 NOTE — Telephone Encounter (Signed)
We do not do medical clearance for surgery, mainly because she will need an EKG. She will need to establish with PCP.

## 2022-05-05 ENCOUNTER — Ambulatory Visit: Payer: Self-pay

## 2022-05-14 ENCOUNTER — Ambulatory Visit: Payer: Self-pay | Admitting: Nurse Practitioner
